# Patient Record
Sex: Female | Born: 1980 | Race: Black or African American | Hispanic: No | Marital: Single | Smoking: Current every day smoker
Health system: Southern US, Community
[De-identification: ages and names within clinical notes are randomized; demographics above are authoritative.]

## PROBLEM LIST (undated history)

## (undated) DIAGNOSIS — K219 Gastro-esophageal reflux disease without esophagitis: Secondary | ICD-10-CM

---

## 1998-02-17 ENCOUNTER — Other Ambulatory Visit: Admission: RE | Admit: 1998-02-17 | Discharge: 1998-02-17 | Payer: Self-pay | Admitting: Gynecology

## 1999-04-25 ENCOUNTER — Other Ambulatory Visit: Admission: RE | Admit: 1999-04-25 | Discharge: 1999-04-25 | Payer: Self-pay | Admitting: Gynecology

## 2000-04-25 ENCOUNTER — Other Ambulatory Visit: Admission: RE | Admit: 2000-04-25 | Discharge: 2000-04-25 | Payer: Self-pay | Admitting: Gynecology

## 2001-05-26 ENCOUNTER — Other Ambulatory Visit: Admission: RE | Admit: 2001-05-26 | Discharge: 2001-05-26 | Payer: Self-pay | Admitting: Gynecology

## 2002-07-23 ENCOUNTER — Other Ambulatory Visit: Admission: RE | Admit: 2002-07-23 | Discharge: 2002-07-23 | Payer: Self-pay | Admitting: Gynecology

## 2004-08-06 ENCOUNTER — Emergency Department (HOSPITAL_COMMUNITY): Admission: EM | Admit: 2004-08-06 | Discharge: 2004-08-06 | Payer: Self-pay | Admitting: Emergency Medicine

## 2004-10-14 ENCOUNTER — Emergency Department (HOSPITAL_COMMUNITY): Admission: EM | Admit: 2004-10-14 | Discharge: 2004-10-14 | Payer: Self-pay | Admitting: *Deleted

## 2005-10-11 ENCOUNTER — Emergency Department (HOSPITAL_COMMUNITY): Admission: EM | Admit: 2005-10-11 | Discharge: 2005-10-11 | Payer: Self-pay | Admitting: Family Medicine

## 2006-04-01 ENCOUNTER — Inpatient Hospital Stay (HOSPITAL_COMMUNITY): Admission: AD | Admit: 2006-04-01 | Discharge: 2006-04-01 | Payer: Self-pay

## 2006-04-12 ENCOUNTER — Inpatient Hospital Stay (HOSPITAL_COMMUNITY): Admission: AD | Admit: 2006-04-12 | Discharge: 2006-04-12 | Payer: Self-pay | Admitting: Obstetrics & Gynecology

## 2006-07-26 ENCOUNTER — Inpatient Hospital Stay (HOSPITAL_COMMUNITY): Admission: AD | Admit: 2006-07-26 | Discharge: 2006-07-26 | Payer: Self-pay | Admitting: Obstetrics and Gynecology

## 2006-11-18 ENCOUNTER — Inpatient Hospital Stay (HOSPITAL_COMMUNITY): Admission: AD | Admit: 2006-11-18 | Discharge: 2006-11-21 | Payer: Self-pay | Admitting: Obstetrics and Gynecology

## 2009-02-04 ENCOUNTER — Inpatient Hospital Stay (HOSPITAL_COMMUNITY): Admission: AD | Admit: 2009-02-04 | Discharge: 2009-02-04 | Payer: Self-pay | Admitting: Obstetrics and Gynecology

## 2009-05-11 ENCOUNTER — Emergency Department (HOSPITAL_COMMUNITY): Admission: EM | Admit: 2009-05-11 | Discharge: 2009-05-11 | Payer: Self-pay | Admitting: Emergency Medicine

## 2009-05-12 ENCOUNTER — Emergency Department (HOSPITAL_COMMUNITY): Admission: EM | Admit: 2009-05-12 | Discharge: 2009-05-12 | Payer: Self-pay | Admitting: Emergency Medicine

## 2009-07-19 ENCOUNTER — Inpatient Hospital Stay (HOSPITAL_COMMUNITY): Admission: AD | Admit: 2009-07-19 | Discharge: 2009-07-19 | Payer: Self-pay | Admitting: Obstetrics and Gynecology

## 2009-08-14 ENCOUNTER — Inpatient Hospital Stay (HOSPITAL_COMMUNITY): Admission: AD | Admit: 2009-08-14 | Discharge: 2009-08-14 | Payer: Self-pay | Admitting: Obstetrics and Gynecology

## 2009-08-24 ENCOUNTER — Inpatient Hospital Stay (HOSPITAL_COMMUNITY): Admission: AD | Admit: 2009-08-24 | Discharge: 2009-08-24 | Payer: Self-pay | Admitting: Obstetrics and Gynecology

## 2009-08-25 ENCOUNTER — Inpatient Hospital Stay (HOSPITAL_COMMUNITY): Admission: AD | Admit: 2009-08-25 | Discharge: 2009-08-25 | Payer: Self-pay | Admitting: Obstetrics and Gynecology

## 2009-08-26 ENCOUNTER — Inpatient Hospital Stay (HOSPITAL_COMMUNITY): Admission: AD | Admit: 2009-08-26 | Discharge: 2009-08-26 | Payer: Self-pay | Admitting: Obstetrics & Gynecology

## 2009-08-28 ENCOUNTER — Inpatient Hospital Stay (HOSPITAL_COMMUNITY): Admission: AD | Admit: 2009-08-28 | Discharge: 2009-08-28 | Payer: Self-pay | Admitting: Obstetrics & Gynecology

## 2009-08-28 ENCOUNTER — Inpatient Hospital Stay (HOSPITAL_COMMUNITY): Admission: AD | Admit: 2009-08-28 | Discharge: 2009-08-30 | Payer: Self-pay | Admitting: Obstetrics & Gynecology

## 2010-11-25 ENCOUNTER — Encounter: Payer: Self-pay | Admitting: Emergency Medicine

## 2011-02-08 LAB — CBC
HCT: 34.7 % — ABNORMAL LOW (ref 36.0–46.0)
HCT: 42.3 % (ref 36.0–46.0)
Hemoglobin: 13.8 g/dL (ref 12.0–15.0)
MCHC: 32.5 g/dL (ref 30.0–36.0)
MCV: 90.2 fL (ref 78.0–100.0)
Platelets: 206 10*3/uL (ref 150–400)
Platelets: 247 10*3/uL (ref 150–400)
RBC: 4.7 MIL/uL (ref 3.87–5.11)
RDW: 14.8 % (ref 11.5–15.5)
RDW: 15.1 % (ref 11.5–15.5)
WBC: 15.4 10*3/uL — ABNORMAL HIGH (ref 4.0–10.5)

## 2011-02-08 LAB — WET PREP, GENITAL

## 2011-02-08 LAB — URINALYSIS, ROUTINE W REFLEX MICROSCOPIC
Hgb urine dipstick: NEGATIVE
Nitrite: NEGATIVE
Specific Gravity, Urine: 1.02 (ref 1.005–1.030)
Urobilinogen, UA: 1 mg/dL (ref 0.0–1.0)

## 2011-02-08 LAB — RPR: RPR Ser Ql: NONREACTIVE

## 2011-02-08 LAB — STREP B DNA PROBE: Strep Group B Ag: NEGATIVE

## 2011-02-09 LAB — URINE MICROSCOPIC-ADD ON

## 2011-02-09 LAB — CULTURE, BETA STREP (GROUP B ONLY)

## 2011-02-09 LAB — WET PREP, GENITAL
Trich, Wet Prep: NONE SEEN
Yeast Wet Prep HPF POC: NONE SEEN

## 2011-02-09 LAB — URINALYSIS, ROUTINE W REFLEX MICROSCOPIC
Nitrite: NEGATIVE
Specific Gravity, Urine: 1.02 (ref 1.005–1.030)
pH: 6 (ref 5.0–8.0)

## 2011-02-14 LAB — URINALYSIS, ROUTINE W REFLEX MICROSCOPIC
Hgb urine dipstick: NEGATIVE
Protein, ur: 100 mg/dL — AB
Specific Gravity, Urine: 1.02 (ref 1.005–1.030)
Urobilinogen, UA: 1 mg/dL (ref 0.0–1.0)

## 2011-02-14 LAB — URINE MICROSCOPIC-ADD ON

## 2011-02-15 LAB — POCT PREGNANCY, URINE: Preg Test, Ur: POSITIVE

## 2011-03-23 NOTE — Discharge Summary (Signed)
NAMEREY, FORS                ACCOUNT NO.:  [id number]   MEDICAL RECORD NO.:  [id number]          PATIENT TYPE:  INP   LOCATION:  9125                          FACILITY:  [facility]   PHYSICIAN:  Gerrit Friends. Aldona Bar, M.D.   DATE OF BIRTH:  13-Apr-1981   DATE OF ADMISSION:  11/18/2006  DATE OF DISCHARGE:  11/21/2006                               DISCHARGE SUMMARY   DISCHARGE DIAGNOSES:  1. A 38-week intrauterine pregnancy delivered at 6 pounds, female      infant, Apgar's 5 and 8.  2. Blood type AB positive.  3. Preeclampsia.   PROCEDURES:  1. Induction of labor.  2. Normal spontaneous delivery.   HOSPITAL COURSE:  This 30 year old, G2, P0 was admitted for induction  because of mild preeclampsia.  She originally was placed on magnesium  sulfate, but on the morning of January 15, it was discontinued.  She  underwent amniotomy with production of clear fluid, having received  Cytotec through the evening of January 14/January 15.  Pitocin was  continued.  She progressed and had a normal spontaneous delivery of a  viable 6 pound, female infant with Apgar's of 5 and 8 over intact  perineum.  Her postpartum course was relatively uncomplicated.  Her baby  was finally circumcised on the morning of January 17.  Her labs on the  morning of January 17, found her hemoglobin at 11.5, white count 13,400,  platelet count 212,000.  A comprehensive metabolic profile was entirely  within normal limits.  Her alkaline phosphatase was still elevated at  1046, but had fallen from a predelivery level of 18,000-19,000.  On the  morning of January 17, her vital signs were stable.  She was breast- and  bottle-feeding.  She was comfortable and decision was made to discharge  the patient to home.  She was given all appropriate instructions and  understood all instructions well.   DISCHARGE MEDICATIONS:  1. Vitamins one a day.  2. Motrin 600 mg as needed every 6 hours for cramping.  3. Tylox one to two every 4-6 hours  as needed for more severe pain.   FOLLOW UP:  Followup in the office will be arranged 4 weeks hence.   CONDITION ON DISCHARGE:  Improved.      Gerrit Friends. Aldona Bar, M.D.  Electronically Signed     RMW/MEDQ  D:  11/21/2006  T:  11/21/2006  Job:  [id number]

## 2011-07-29 ENCOUNTER — Emergency Department (HOSPITAL_COMMUNITY): Payer: Medicaid Other

## 2011-07-29 ENCOUNTER — Emergency Department (HOSPITAL_COMMUNITY)
Admission: EM | Admit: 2011-07-29 | Discharge: 2011-07-29 | Disposition: A | Discharge status: Home / Self Care | Payer: Medicaid Other | Attending: Emergency Medicine | Admitting: Emergency Medicine

## 2011-07-29 DIAGNOSIS — N83209 Unspecified ovarian cyst, unspecified side: Secondary | ICD-10-CM | POA: Insufficient documentation

## 2011-07-29 DIAGNOSIS — R1011 Right upper quadrant pain: Secondary | ICD-10-CM | POA: Insufficient documentation

## 2011-07-29 DIAGNOSIS — R1012 Left upper quadrant pain: Secondary | ICD-10-CM | POA: Insufficient documentation

## 2011-07-29 DIAGNOSIS — R1033 Periumbilical pain: Secondary | ICD-10-CM | POA: Insufficient documentation

## 2011-07-29 LAB — URINALYSIS, ROUTINE W REFLEX MICROSCOPIC
Ketones, ur: NEGATIVE mg/dL
Leukocytes, UA: NEGATIVE
Nitrite: NEGATIVE
Urobilinogen, UA: 1 mg/dL (ref 0.0–1.0)
pH: 8 (ref 5.0–8.0)

## 2011-07-29 LAB — COMPREHENSIVE METABOLIC PANEL
ALT: 19 U/L (ref 0–35)
AST: 14 U/L (ref 0–37)
Albumin: 4 g/dL (ref 3.5–5.2)
Alkaline Phosphatase: 55 U/L (ref 39–117)
Chloride: 103 mEq/L (ref 96–112)
Potassium: 4.1 mEq/L (ref 3.5–5.1)
Sodium: 137 mEq/L (ref 135–145)
Total Bilirubin: 0.4 mg/dL (ref 0.3–1.2)
Total Protein: 7.3 g/dL (ref 6.0–8.3)

## 2011-07-29 LAB — DIFFERENTIAL
Basophils Absolute: 0 10*3/uL (ref 0.0–0.1)
Basophils Relative: 0 % (ref 0–1)
Eosinophils Absolute: 0.2 10*3/uL (ref 0.0–0.7)
Eosinophils Relative: 1 % (ref 0–5)
Lymphocytes Relative: 28 % (ref 12–46)

## 2011-07-29 LAB — CBC
HCT: 42.8 % (ref 36.0–46.0)
MCHC: 32.7 g/dL (ref 30.0–36.0)
Platelets: 232 10*3/uL (ref 150–400)
RDW: 14.9 % (ref 11.5–15.5)
WBC: 12.5 10*3/uL — ABNORMAL HIGH (ref 4.0–10.5)

## 2011-07-29 LAB — POCT PREGNANCY, URINE: Preg Test, Ur: NEGATIVE

## 2011-07-29 MED ORDER — IOHEXOL 300 MG/ML  SOLN: 100.0000 mL | Freq: Once | INTRAMUSCULAR | Status: DC | PRN

## 2012-02-18 ENCOUNTER — Encounter (HOSPITAL_COMMUNITY): Payer: Self-pay | Admitting: Emergency Medicine

## 2012-02-18 ENCOUNTER — Emergency Department (HOSPITAL_COMMUNITY)
Admission: EM | Admit: 2012-02-18 | Discharge: 2012-02-18 | Disposition: A | Discharge status: Home / Self Care | Payer: Self-pay | Attending: Emergency Medicine | Admitting: Emergency Medicine

## 2012-02-18 DIAGNOSIS — H6120 Impacted cerumen, unspecified ear: Secondary | ICD-10-CM | POA: Insufficient documentation

## 2012-02-18 DIAGNOSIS — J069 Acute upper respiratory infection, unspecified: Secondary | ICD-10-CM | POA: Insufficient documentation

## 2012-02-18 DIAGNOSIS — F17200 Nicotine dependence, unspecified, uncomplicated: Secondary | ICD-10-CM | POA: Insufficient documentation

## 2012-02-18 MED ORDER — ONDANSETRON 4 MG PO TBDP: 4.0000 mg | ORAL_TABLET | Freq: Three times a day (TID) | ORAL | Status: AC | PRN

## 2012-02-18 MED ORDER — CARBAMIDE PEROXIDE 6.5 % OT SOLN: 5.0000 [drp] | Freq: Two times a day (BID) | OTIC | Status: AC

## 2012-02-18 MED ORDER — GUAIFENESIN-CODEINE 100-10 MG/5ML PO SYRP: 5.0000 mL | ORAL_SOLUTION | Freq: Three times a day (TID) | ORAL | Status: DC | PRN

## 2012-02-18 MED ORDER — OXYMETAZOLINE HCL 0.05 % NA SOLN: 2.0000 | Freq: Two times a day (BID) | NASAL | Status: AC

## 2012-02-18 MED ORDER — GUAIFENESIN-CODEINE 100-10 MG/5ML PO SYRP: 5.0000 mL | ORAL_SOLUTION | Freq: Three times a day (TID) | ORAL | Status: AC | PRN

## 2012-02-18 NOTE — ED Notes (Signed)
Pt is c/o pain in her right ear that started about a week ago.  Pt states her sore throat, chills, body aches started about Friday  Pt states she is thirsty but is unable to drink due to pain in her throat  Pt states she has a decreased appetite

## 2012-02-18 NOTE — ED Provider Notes (Signed)
History     CSN: [id number]  Arrival date & time 02/18/12  4098   First MD Initiated Contact with Patient 02/18/12 1937      Chief Complaint  Patient presents with  . Sore Throat  . Chills    (Consider location/radiation/quality/duration/timing/severity/associated sxs/prior treatment) HPI History from patient. 31 year old female who presents with sore throat, chills, body aches, cough, congestion, all of which started about 3 days ago. She additionally complains of a "stopped up feeling" to her right ear which has been present for the past week. She did try Mucinex and ibuprofen at home for her symptoms, which was somewhat helpful. No other treatments prior to arrival. Her symptoms have been more or less constant in nature and have not worsened or improved. No known sick contacts. Denies any difficulty swallowing, breathing, although states she has had a decreased appetite due to the sore throat. Denies chest pain, shortness of breath, hemoptysis, leg swelling. She has had a cough productive of yellow sputum, which she states makes her have post tussive emesis at times. Denies abdominal pain, diarrhea, constipation. She's had no urinary symptoms or vaginal discharge.  History reviewed. No pertinent past medical history.  History reviewed. No pertinent past surgical history.  Family History  Problem Relation Age of Onset  . Hypertension Other     History  Substance Use Topics  . Smoking status: Current Everyday Smoker    Types: Cigarettes  . Smokeless tobacco: Not on file  . Alcohol Use: No    OB History    Grav Para Term Preterm Abortions TAB SAB Ect Mult Living                  Review of Systems  Constitutional: Positive for appetite change. Negative for fever, chills and activity change.  HENT: Positive for hearing loss, ear pain, congestion and sore throat. Negative for facial swelling, drooling, trouble swallowing and sinus pressure.   Eyes: Negative for discharge,  redness and visual disturbance.  Respiratory: Negative for chest tightness and shortness of breath.   Cardiovascular: Negative for chest pain and palpitations.  Gastrointestinal: Negative for nausea, vomiting and abdominal pain.  Genitourinary: Negative for decreased urine volume.  Musculoskeletal: Positive for myalgias.  Skin: Negative for color change and rash.  Neurological: Negative for dizziness, weakness and headaches.    Allergies  Ampicillin  Home Medications   Current Outpatient Rx  Name Route Sig Dispense Refill  . GUAIFENESIN ER 600 MG PO TB12 Oral Take 1,200 mg by mouth 2 (two) times daily.    . IBUPROFEN 200 MG PO TABS Oral Take 800 mg by mouth every 8 (eight) hours as needed.      BP 129/69  Pulse 84  Temp(Src) 98.9 F (37.2 C) (Oral)  Resp 18  SpO2 96%  Physical Exam  Nursing note and vitals reviewed. Constitutional: She appears well-developed and well-nourished. No distress.  HENT:  Head: Normocephalic and atraumatic.  Mouth/Throat: Oropharynx is clear and moist. No oropharyngeal exudate.       Cerumen impaction to right ear. Left ear with significant cerumen, but TM visualized and appears normal. Sinuses nontender to palpation.  Eyes: EOM are normal. Pupils are equal, round, and reactive to light.  Neck: Normal range of motion. Neck supple.  Cardiovascular: Normal rate, regular rhythm and normal heart sounds.   Pulmonary/Chest: Effort normal and breath sounds normal. No respiratory distress. She exhibits no tenderness.  Abdominal: Soft. Bowel sounds are normal. There is no tenderness.  Musculoskeletal: Normal  range of motion.  Lymphadenopathy:    She has no cervical adenopathy.  Neurological: She is alert.  Skin: Skin is warm and dry. She is not diaphoretic.  Psychiatric: She has a normal mood and affect.    ED Course  EAR CERUMEN REMOVAL Performed by: Grant Fontana Authorized by: Grant Fontana Consent: Verbal consent obtained. Risks  and benefits: risks, benefits and alternatives were discussed Consent given by: patient Local anesthetic: none Location details: right ear Procedure type: curette Patient tolerance: Patient tolerated the procedure well with no immediate complications.   (including critical care time)  Labs Reviewed - No data to display No results found.   1. URI (upper respiratory infection)   2. Cerumen impaction       MDM  Patient presents with multiple symptoms. Suspect likely viral URI. Will treat symptomatically. She was noted to have cerumen impaction, and had improvement with removal with the curette. TM was partially visualized and nl appearing. Tympanic membrane visualized Given prescription for Debrox. Return precautions discussed.        Grant Fontana, [place] 02/20/12 [phone]

## 2012-02-18 NOTE — Discharge Instructions (Signed)
1) Your ear contained wax which was blocking you from being able to hear. We removed some of the wax today. Use the Debrox drops for the next week to help soften and remove the earwax. 2) You've been given a prescription for cough syrup containing codeine to help with the cough. It's fine to continue to take Mucinex D or similar to help with the congestion. 3) Use the Afrin spray, a nasal decongestant, for the next 4 days to help with congestion. Do not use this for longer than 4 days. You may also try salt water (saline) nasal rinses and salt water gargles to help with the congestion and throat pain. 4) If you develop difficulty breathing, high fever, inability to swallow, or other worrisome symptoms, please return to the ER.  RESOURCE GUIDE  Dental Problems  Patients with Medicaid: [facility] [phone]                                           [phone] [address] Phone:  [phone]                                                  Phone:  [phone]  If unable to pay or uninsured, contact:  [facility] or [facility] to become qualified for the adult dental clinic.  Chronic Pain Problems Contact [facility]  [phone] Patients need to be referred by their primary care doctor.  Insufficient Money for Medicine Contact [employer]:  call "211" or [facility] [phone].  No Primary Care Doctor Call [facility]  [phone] Other agencies that provide inexpensive medical care    [facility]  [phone]    [facility]  [phone]    [facility]  [phone]    [facility]  [phone]    [facility]  [phone]    [facility]  [phone]  [facility]  [phone] [facility] Services  [phone] [facility]   [phone] (emergency services [phone])  Substance Abuse Resources Alcohol and  Drug Services  ([phone] [facility] ([phone] [facility] 440-604-2399 Floydene Flock ([phone] Residential & Outpatient Substance Abuse Program  639-464-0323  Abuse/Neglect [facility] Child Abuse Hotline 708 640 4884 [facility] Child Abuse Hotline (863)570-5801 (After Hours)  Emergency Shelter [facility] [phone]  M[facility] [place] [facility] 7377872961 [facility] [phone]  MRSA Hotline #:   805-026-8153    [facility] Resources  [facility]     [employer]                          [facility] Dept. [address] [place]                       [address]      [address]  [address]  Cristobal Goldmann Phone:  [phone]                                   Phone:  [phone]                 Phone:  [phone]  [facility] Phone:  [phone]  [facility] Child Abuse Hotline [phone] (After Hours)  Cerumen Impaction A cerumen impaction is when the wax in your ear forms a plug. This plug usually causes reduced hearing. Sometimes it also causes an earache or dizziness. Removing a cerumen impaction can be difficult and painful. The wax sticks to the ear canal. The canal is sensitive and bleeds easily. If you try to remove a heavy wax buildup with a cotton tipped swab, you may push it in further. Irrigation with water, suction, and small ear curettes may be used to clear out the wax. If the impaction is fixed to the skin in the ear canal, ear drops may be needed for a few days to loosen the wax. People who build up a lot of wax frequently can use ear wax removal products available in your local drugstore. SEEK MEDICAL CARE IF:  You develop an earache, increased hearing loss, or marked dizziness. Document Released:  11/29/2004 Document Revised: 10/11/2011 Document Reviewed: 01/19/2010 [facility] Patient Information 2012 [place], [place].Antibiotic Nonuse  Your caregiver felt that the infection or problem was not one that would be helped with an antibiotic. Infections may be caused by viruses or bacteria. Only a caregiver can tell which one of these is the likely cause of an illness. A cold is the most common cause of infection in both adults and children. A cold is a virus. Antibiotic treatment will have no effect on a viral infection. Viruses can lead to many lost days of work caring for sick children and many missed days of school. Children may catch as many as 10 "colds" or "flus" per year during which they can be tearful, cranky, and uncomfortable. The goal of treating a virus is aimed at keeping the ill person comfortable. Antibiotics are medications used to help the body fight bacterial infections. There are relatively few types of bacteria that cause infections but there are hundreds of viruses. While both viruses and bacteria cause infection they are very different types of germs. A viral infection will typically go away by itself within 7 to 10 days. Bacterial infections may spread or get worse without antibiotic treatment. Examples of bacterial infections are:  Sore throats (like strep throat or tonsillitis).   Infection in the lung (pneumonia).   Ear and skin infections.  Examples of viral infections are:  Colds or flus.   Most coughs and bronchitis.   Sore throats not caused by Strep.   Runny noses.  It is often best not to take an antibiotic when a viral infection is the cause of the problem. Antibiotics can kill off the helpful bacteria that we have inside our body and allow harmful bacteria to start growing. Antibiotics can cause side effects such as allergies, nausea, and diarrhea without helping to improve the symptoms of the viral infection. Additionally, repeated uses of antibiotics can  cause bacteria inside of our body to become resistant. That resistance can be  passed onto harmful bacterial. The next time you have an infection it may be harder to treat if antibiotics are used when they are not needed. Not treating with antibiotics allows our own immune system to develop and take care of infections more efficiently. Also, antibiotics will work better for [place] when they are prescribed for bacterial infections. Treatments for a child that is ill may include:  Give extra fluids throughout the day to stay hydrated.   Get plenty of rest.   Only give your child over-the-counter or prescription medicines for pain, discomfort, or fever as directed by your caregiver.   The use of a cool mist humidifier may help stuffy noses.   Cold medications if suggested by your caregiver.  Your caregiver may decide to start you on an antibiotic if:  The problem you were seen for today continues for a longer length of time than expected.   You develop a secondary bacterial infection.  SEEK MEDICAL CARE IF:  Fever lasts longer than 5 days.   Symptoms continue to get worse after 5 to 7 days or become severe.   Difficulty in breathing develops.   Signs of dehydration develop (poor drinking, rare urinating, dark colored urine).   Changes in behavior or worsening tiredness (listlessness or lethargy).  Document Released: 12/31/2001 Document Revised: 10/11/2011 Document Reviewed: 06/29/2009 [facility] Patient Information 2012 [place], [place].

## 2012-02-22 NOTE — ED Provider Notes (Signed)
Medical screening examination/treatment/procedure(s) were performed by non-physician practitioner and as supervising physician I was immediately available for consultation/collaboration.  Dionicia Cerritos K Linker, MD 02/22/12 1522 

## 2012-04-30 ENCOUNTER — Emergency Department (HOSPITAL_COMMUNITY): Payer: Self-pay

## 2012-04-30 ENCOUNTER — Encounter (HOSPITAL_COMMUNITY): Payer: Self-pay | Admitting: Emergency Medicine

## 2012-04-30 ENCOUNTER — Emergency Department (HOSPITAL_COMMUNITY)
Admission: EM | Admit: 2012-04-30 | Discharge: 2012-04-30 | Disposition: A | Discharge status: Home / Self Care | Payer: Self-pay | Attending: Emergency Medicine | Admitting: Emergency Medicine

## 2012-04-30 DIAGNOSIS — N949 Unspecified condition associated with female genital organs and menstrual cycle: Secondary | ICD-10-CM | POA: Insufficient documentation

## 2012-04-30 DIAGNOSIS — R1032 Left lower quadrant pain: Secondary | ICD-10-CM | POA: Insufficient documentation

## 2012-04-30 DIAGNOSIS — N83209 Unspecified ovarian cyst, unspecified side: Secondary | ICD-10-CM | POA: Insufficient documentation

## 2012-04-30 DIAGNOSIS — R10815 Periumbilic abdominal tenderness: Secondary | ICD-10-CM | POA: Insufficient documentation

## 2012-04-30 LAB — URINALYSIS, ROUTINE W REFLEX MICROSCOPIC
Glucose, UA: NEGATIVE mg/dL
Leukocytes, UA: NEGATIVE
Nitrite: NEGATIVE
Protein, ur: 100 mg/dL — AB
Specific Gravity, Urine: 1.037 — ABNORMAL HIGH (ref 1.005–1.030)

## 2012-04-30 LAB — CBC WITH DIFFERENTIAL/PLATELET
Basophils Absolute: 0.1 10*3/uL (ref 0.0–0.1)
Basophils Relative: 1 % (ref 0–1)
Eosinophils Absolute: 0 10*3/uL (ref 0.0–0.7)
Eosinophils Relative: 0 % (ref 0–5)
HCT: 44.2 % (ref 36.0–46.0)
Lymphocytes Relative: 20 % (ref 12–46)
MCHC: 33 g/dL (ref 30.0–36.0)
MCV: 85.5 fL (ref 78.0–100.0)
Monocytes Absolute: 0.6 10*3/uL (ref 0.1–1.0)
Platelets: 226 10*3/uL (ref 150–400)
RDW: 15 % (ref 11.5–15.5)
WBC: 13 10*3/uL — ABNORMAL HIGH (ref 4.0–10.5)

## 2012-04-30 LAB — URINE MICROSCOPIC-ADD ON

## 2012-04-30 LAB — COMPREHENSIVE METABOLIC PANEL
ALT: 12 U/L (ref 0–35)
AST: 13 U/L (ref 0–37)
Albumin: 4.3 g/dL (ref 3.5–5.2)
CO2: 17 mEq/L — ABNORMAL LOW (ref 19–32)
Calcium: 9.4 mg/dL (ref 8.4–10.5)
Creatinine, Ser: 0.58 mg/dL (ref 0.50–1.10)
GFR calc non Af Amer: >90 mL/min (ref >90)
Sodium: 136 mEq/L (ref 135–145)
Total Protein: 7.6 g/dL (ref 6.0–8.3)

## 2012-04-30 LAB — WET PREP, GENITAL
Clue Cells Wet Prep HPF POC: NONE SEEN
Trich, Wet Prep: NONE SEEN

## 2012-04-30 MED ORDER — IOHEXOL 300 MG/ML  SOLN
100.0000 mL | Freq: Once | INTRAMUSCULAR | Status: AC | PRN
  Administered 2012-04-30: 100 mL via INTRAVENOUS

## 2012-04-30 MED ORDER — MORPHINE SULFATE 4 MG/ML IJ SOLN
4.0000 mg | Freq: Once | INTRAMUSCULAR | Status: AC
  Administered 2012-04-30: 4 mg via INTRAVENOUS
  Filled 2012-04-30: qty 1

## 2012-04-30 MED ORDER — ONDANSETRON HCL 4 MG/2ML IJ SOLN
4.0000 mg | Freq: Once | INTRAMUSCULAR | Status: AC
  Administered 2012-04-30: 4 mg via INTRAVENOUS
  Filled 2012-04-30: qty 2

## 2012-04-30 MED ORDER — IBUPROFEN 800 MG PO TABS: 800.0000 mg | ORAL_TABLET | Freq: Three times a day (TID) | ORAL | Status: AC

## 2012-04-30 MED ORDER — KETOROLAC TROMETHAMINE 30 MG/ML IJ SOLN
30.0000 mg | Freq: Once | INTRAMUSCULAR | Status: AC
  Administered 2012-04-30: 30 mg via INTRAVENOUS
  Filled 2012-04-30: qty 1

## 2012-04-30 MED ORDER — HYDROCODONE-ACETAMINOPHEN 5-500 MG PO TABS: 1.0000 | ORAL_TABLET | Freq: Four times a day (QID) | ORAL | Status: AC | PRN

## 2012-04-30 MED ORDER — ONDANSETRON HCL 4 MG/2ML IJ SOLN: 4.0000 mg | Freq: Once | INTRAMUSCULAR | Status: DC

## 2012-04-30 MED ORDER — ONDANSETRON HCL 4 MG PO TABS: 4.0000 mg | ORAL_TABLET | Freq: Four times a day (QID) | ORAL | Status: AC

## 2012-04-30 NOTE — Discharge Instructions (Signed)
Use ibuprofen as directed for inflammation and pain using hydrocodone-acetaminophen for breakthrough pain but do not drive or operate machinery with hydrocodone-acetaminophen use. Use Zofran as needed for nausea. It is very important to followup with your OB/GYN in the near future to further discuss your ovarian cyst and ongoing pain however return to the emergency department for any emergent changing or worsening of symptoms.  Ovarian Cyst The ovaries are small organs that are on each side of the uterus. The ovaries are the organs that produce the female hormones, estrogen and progesterone. An ovarian cyst is a sac filled with fluid that can vary in its size. It is normal for a small cyst to form in women who are in the childbearing age and who have menstrual periods. This type of cyst is called a follicle cyst that becomes an ovulation cyst (corpus luteum cyst) after it produces the women's egg. It later goes away on its own if the woman does not become pregnant. There are other kinds of ovarian cysts that may cause problems and may need to be treated. The most serious problem is a cyst with cancer. It should be noted that menopausal women who have an ovarian cyst are at a higher risk of it being a cancer cyst. They should be evaluated very quickly, thoroughly and followed closely. This is especially true in menopausal women because of the high rate of ovarian cancer in women in menopause. CAUSES AND TYPES OF OVARIAN CYSTS:  FUNCTIONAL CYST: The follicle/corpus luteum cyst is a functional cyst that occurs every month during ovulation with the menstrual cycle. They go away with the next menstrual cycle if the woman does not get pregnant. Usually, there are no symptoms with a functional cyst.   ENDOMETRIOMA CYST: This cyst develops from the lining of the uterus tissue. This cyst gets in or on the ovary. It grows every month from the bleeding during the menstrual period. It is also called a "chocolate cyst"  because it becomes filled with blood that turns brown. This cyst can cause pain in the lower abdomen during intercourse and with your menstrual period.   CYSTADENOMA CYST: This cyst develops from the cells on the outside of the ovary. They usually are not cancerous. They can get very big and cause lower abdomen pain and pain with intercourse. This type of cyst can twist on itself, cut off its blood supply and cause severe pain. It also can easily rupture and cause a lot of pain.   DERMOID CYST: This type of cyst is sometimes found in both ovaries. They are found to have different kinds of body tissue in the cyst. The tissue includes skin, teeth, hair, and/or cartilage. They usually do not have symptoms unless they get very big. Dermoid cysts are rarely cancerous.   POLYCYSTIC OVARY: This is a rare condition with hormone problems that produces many small cysts on both ovaries. The cysts are follicle-like cysts that never produce an egg and become a corpus luteum. It can cause an increase in body weight, infertility, acne, increase in body and facial hair and lack of menstrual periods or rare menstrual periods. Many women with this problem develop type 2 diabetes. The exact cause of this problem is unknown. A polycystic ovary is rarely cancerous.   THECA LUTEIN CYST: Occurs when too much hormone (human chorionic gonadotropin) is produced and over-stimulates the ovaries to produce an egg. They are frequently seen when doctors stimulate the ovaries for invitro-fertilization (test tube babies).   LUTEOMA  CYST: This cyst is seen during pregnancy. Rarely it can cause an obstruction to the birth canal during labor and delivery. They usually go away after delivery.  SYMPTOMS   Pelvic pain or pressure.   Pain during sexual intercourse.   Increasing girth (swelling) of the abdomen.   Abnormal menstrual periods.   Increasing pain with menstrual periods.   You stop having menstrual periods and you are not  pregnant.  DIAGNOSIS  The diagnosis can be made during:  Routine or annual pelvic examination (common).   Ultrasound.   X-ray of the pelvis.   CT Scan.   MRI.   Blood tests.  TREATMENT   Treatment may only be to follow the cyst monthly for 2 to 3 months with your caregiver. Many go away on their own, especially functional cysts.   May be aspirated (drained) with a long needle with ultrasound, or by laparoscopy (inserting a tube into the pelvis through a small incision).   The whole cyst can be removed by laparoscopy.   Sometimes the cyst may need to be removed through an incision in the lower abdomen.   Hormone treatment is sometimes used to help dissolve certain cysts.   Birth control pills are sometimes used to help dissolve certain cysts.  HOME CARE INSTRUCTIONS  Follow your caregiver's advice regarding:  Medicine.   Follow up visits to evaluate and treat the cyst.   You may need to come back or make an appointment with another caregiver, to find the exact cause of your cyst, if your caregiver is not a gynecologist.   Get your yearly and recommended pelvic examinations and Pap tests.   Let your caregiver know if you have had an ovarian cyst in the past.  SEEK MEDICAL CARE IF:   Your periods are late, irregular, they stop, or are painful.   Your stomach (abdomen) or pelvic pain does not go away.   Your stomach becomes larger or swollen.   You have pressure on your bladder or trouble emptying your bladder completely.   You have painful sexual intercourse.   You have feelings of fullness, pressure, or discomfort in your stomach.   You lose weight for no apparent reason.   You feel generally ill.   You become constipated.   You lose your appetite.   You develop acne.   You have an increase in body and facial hair.   You are gaining weight, without changing your exercise and eating habits.   You think you are pregnant.  SEEK IMMEDIATE MEDICAL CARE  IF:   You have increasing abdominal pain.   You feel sick to your stomach (nausea) and/or vomit.   You develop a fever that comes on suddenly.   You develop abdominal pain during a bowel movement.   Your menstrual periods become heavier than usual.  Document Released: 10/22/2005 Document Revised: 10/11/2011 Document Reviewed: 08/25/2009 [facility] Patient Information 2012 [place], [place].

## 2012-04-30 NOTE — ED Notes (Signed)
Bed:WA13<BR> Expected date:<BR> Expected time:<BR> Means of arrival:<BR> Comments:<BR> Abdominal pain

## 2012-04-30 NOTE — ED Provider Notes (Signed)
History     CSN: [id number]  Arrival date & time 04/30/12  1029   First MD Initiated Contact with Patient 04/30/12 1031      No chief complaint on file.   (Consider location/radiation/quality/duration/timing/severity/associated sxs/prior treatment) HPI  Patient presents to emergency department by EMS with complaints of acute onset abdominal pain with associated nausea, vomiting, diarrhea that began this morning between 6 and 7 AM. Patient states that she woke suddenly with severe abdominal pain and then went to the bathroom and had subsequent vomiting and diarrhea. Patient states that since then she's had persistent and worsening abdominal pain with numerous episodes of vomiting and ongoing nausea. Patient states that she has in the Mirena IUD and therefore has unknown LMP. She's G2 P2. She has no history of abdominal surgeries. Patient denies recent sick contacts or others in her home being ill. Denies fevers, chills, chest pain, shortness of breath dysuria, hematuria, or blood in her stool. IV was started by EMS. Patient states she has no known medical problems takes no medicines on regular basis. She denies aggravating or alleviating factors.  No past medical history on file.  No past surgical history on file.  Family History  Problem Relation Age of Onset  . Hypertension Other     History  Substance Use Topics  . Smoking status: Current Everyday Smoker    Types: Cigarettes  . Smokeless tobacco: Not on file  . Alcohol Use: No    OB History    Grav Para Term Preterm Abortions TAB SAB Ect Mult Living                  Review of Systems  All other systems reviewed and are negative.    Allergies  Ampicillin  Home Medications   Current Outpatient Rx  Name Route Sig Dispense Refill  . GUAIFENESIN ER 600 MG PO TB12 Oral Take 1,200 mg by mouth 2 (two) times daily.    . IBUPROFEN 200 MG PO TABS Oral Take 800 mg by mouth every 8 (eight) hours as needed.      There were  no vitals taken for this visit.  Physical Exam  Nursing note and vitals reviewed. Constitutional: She is oriented to person, place, and time. She appears well-developed and well-nourished. No distress.  HENT:  Head: Normocephalic and atraumatic.  Eyes: Conjunctivae are normal.  Neck: Normal range of motion. Neck supple.  Cardiovascular: Normal rate, regular rhythm, normal heart sounds and intact distal pulses.  Exam reveals no gallop and no friction rub.   No murmur heard. Pulmonary/Chest: Effort normal and breath sounds normal. No respiratory distress. She has no wheezes. She has no rales. She exhibits no tenderness.  Abdominal: Soft. Bowel sounds are normal. She exhibits no distension and no mass. There is tenderness. There is no rebound and no guarding.       Abdomen soft with TTP in umbilical region. No peritoneal signs.   Genitourinary: Vagina normal. There is no rash, tenderness or lesion on the right labia. There is no rash, tenderness or lesion on the left labia. Uterus is tender. Cervix exhibits no discharge and no friability. Right adnexum displays no mass, no tenderness and no fullness. Left adnexum displays tenderness. Left adnexum displays no mass and no fullness.       Unable to visualize IUD strings. Mild pain with movement of cervix and palpation of uterus.   Musculoskeletal: Normal range of motion.  Neurological: She is alert and oriented to person, place, and  time.  Skin: Skin is warm and dry. No rash noted. She is not diaphoretic. No erythema.  Psychiatric: She has a normal mood and affect.    ED Course  Procedures (including critical care time)  IV fluids, IV morphine and zofran  Improved pain from 10/10 to 4/10   Labs Reviewed  CBC WITH DIFFERENTIAL - Abnormal; Notable for the following:    WBC 13.0 (*)     RBC 5.17 (*)     Neutro Abs 9.7 (*)     All other components within normal limits  COMPREHENSIVE METABOLIC PANEL - Abnormal; Notable for the following:     CO2 17 (*)     Glucose, Bld 131 (*)     Total Bilirubin 0.2 (*)     All other components within normal limits  URINALYSIS, ROUTINE W REFLEX MICROSCOPIC - Abnormal; Notable for the following:    Color, Urine AMBER (*)  BIOCHEMICALS MAY BE AFFECTED BY COLOR   APPearance CLOUDY (*)     Specific Gravity, Urine 1.037 (*)     Bilirubin Urine SMALL (*)     Ketones, ur TRACE (*)     Protein, ur 100 (*)     All other components within normal limits  URINE MICROSCOPIC-ADD ON - Abnormal; Notable for the following:    Squamous Epithelial / LPF FEW (*)     Bacteria, UA FEW (*)     All other components within normal limits  LIPASE, BLOOD  POCT PREGNANCY, URINE   US Transvaginal Non-ob  04/30/2012  *RADIOLOGY REPORT*  Clinical Data:  Pelvic pain.  Left lower quadrant.  TRANSABDOMINAL AND TRANSVAGINAL ULTRASOUND OF PELVIS DOPPLER ULTRASOUND OF OVARIES  Technique:  Both transabdominal and transvaginal ultrasound examinations of the pelvis were performed. Transabdominal technique was performed for global imaging of the pelvis including uterus, ovaries, adnexal regions, and pelvic cul-de-sac.  It was necessary to proceed with endovaginal exam following the transabdominal exam to visualize the adnexa.  Color and duplex Doppler ultrasound was utilized to evaluate blood flow to the ovaries.  Comparison:  CT earlier today.  Findings:  Uterus:  7.3 x 4.6 x 5.4 cm.  No myometrial masses.  Endometrium:  IUD is in place.  7 mm in thickness and uniform.  Right ovary: 3.1 x 2.2 x 2.3 cm.  Within normal limits.  Left ovary:   4.2 x 3.1 x 3.1 cm.  The left ovary contains a 1.9 x 1.8 x 1.5 cm complex cyst with heterogeneous internal signal.  Pulsed Doppler evaluation demonstrates normal low-resistance arterial and venous waveforms in both ovaries.  IMPRESSION: 1.9 cm complex cyst in the left ovary.  Follow-up ultrasound in 6 weeks is recommended to ensure resolution.  Arterial and venous flow is documented in both ovaries.  No  evidence of torsion.  Original Report Authenticated By: Donavan Burnet, M.D.   US Pelvis Complete  04/30/2012  *RADIOLOGY REPORT*  Clinical Data:  Pelvic pain.  Left lower quadrant.  TRANSABDOMINAL AND TRANSVAGINAL ULTRASOUND OF PELVIS DOPPLER ULTRASOUND OF OVARIES  Technique:  Both transabdominal and transvaginal ultrasound examinations of the pelvis were performed. Transabdominal technique was performed for global imaging of the pelvis including uterus, ovaries, adnexal regions, and pelvic cul-de-sac.  It was necessary to proceed with endovaginal exam following the transabdominal exam to visualize the adnexa.  Color and duplex Doppler ultrasound was utilized to evaluate blood flow to the ovaries.  Comparison:  CT earlier today.  Findings:  Uterus:  7.3 x 4.6 x 5.4  cm.  No myometrial masses.  Endometrium:  IUD is in place.  7 mm in thickness and uniform.  Right ovary: 3.1 x 2.2 x 2.3 cm.  Within normal limits.  Left ovary:   4.2 x 3.1 x 3.1 cm.  The left ovary contains a 1.9 x 1.8 x 1.5 cm complex cyst with heterogeneous internal signal.  Pulsed Doppler evaluation demonstrates normal low-resistance arterial and venous waveforms in both ovaries.  IMPRESSION: 1.9 cm complex cyst in the left ovary.  Follow-up ultrasound in 6 weeks is recommended to ensure resolution.  Arterial and venous flow is documented in both ovaries.  No evidence of torsion.  Original Report Authenticated By: Donavan Burnet, M.D.   Ct Abdomen Pelvis W Contrast  04/30/2012  *RADIOLOGY REPORT*  Clinical Data: 31 year old with diffuse abdominal pain and history of an IUD.  CT ABDOMEN AND PELVIS WITH CONTRAST  Technique:  Multidetector CT imaging of the abdomen and pelvis was performed following the standard protocol during bolus administration of intravenous contrast.  Contrast: OMNIPAQUE IOHEXOL 300 MG/ML  SOLN  Comparison: CT from 07/29/2011  Findings: The lung bases are clear.  There is no evidence for free intraperitoneal air.   Normal appearance of the liver, gallbladder portal venous system. Normal appearance of the pancreas, spleen, adrenal glands and both kidneys.  There is a small amount of fluid in the cul-de-sac.  An IUD is present within the uterus.  Urinary bladder is decompressed.  No gross abnormality to the right adnexa tissue.  There appears to be a peripherally enhancing irregular cyst in the left ovary that measures up to 2.9 cm.  No acute bony abnormality.  IMPRESSION:  No acute abnormalities within the abdomen or pelvis.  There is a left ovarian cyst roughly measuring 2.9 cm and a small amount of pelvic free fluid.  These findings could represent normal physiologic findings and recommend clinical correlation in this area.  Original Report Authenticated By: Richarda Overlie, M.D.   [place] Art/ven Flow Abd Pelv Doppler  04/30/2012  *RADIOLOGY REPORT*  Clinical Data:  Pelvic pain.  Left lower quadrant.  TRANSABDOMINAL AND TRANSVAGINAL ULTRASOUND OF PELVIS DOPPLER ULTRASOUND OF OVARIES  Technique:  Both transabdominal and transvaginal ultrasound examinations of the pelvis were performed. Transabdominal technique was performed for global imaging of the pelvis including uterus, ovaries, adnexal regions, and pelvic cul-de-sac.  It was necessary to proceed with endovaginal exam following the transabdominal exam to visualize the adnexa.  Color and duplex Doppler ultrasound was utilized to evaluate blood flow to the ovaries.  Comparison:  CT earlier today.  Findings:  Uterus:  7.3 x 4.6 x 5.4 cm.  No myometrial masses.  Endometrium:  IUD is in place.  7 mm in thickness and uniform.  Right ovary: 3.1 x 2.2 x 2.3 cm.  Within normal limits.  Left ovary:   4.2 x 3.1 x 3.1 cm.  The left ovary contains a 1.9 x 1.8 x 1.5 cm complex cyst with heterogeneous internal signal.  Pulsed Doppler evaluation demonstrates normal low-resistance arterial and venous waveforms in both ovaries.  IMPRESSION: 1.9 cm complex cyst in the left ovary.  Follow-up  ultrasound in 6 weeks is recommended to ensure resolution.  Arterial and venous flow is documented in both ovaries.  No evidence of torsion.  Original Report Authenticated By: Donavan Burnet, M.D.     1. Ovarian cyst       MDM  Patient is afebrile with a nonacute abdomen. Pain is much improved. CT  scan and ultrasound showed left ovarian cyst however no evidence of ovarian torsion or any other acute finding. Patient's IUD is seen in uterus and in place. Patient has an established OB/GYN and is agreeable to close followup for further evaluation and management. Discussed changing or worsening symptoms that should prompt immediate return to emergency department. Patient voiced her understanding and is agreeable plan.        [place], [place] 04/30/12 1520

## 2012-04-30 NOTE — ED Notes (Signed)
Pt returned from ultrasound

## 2012-04-30 NOTE — ED Provider Notes (Signed)
Medical screening examination/treatment/procedure(s) were performed by non-physician practitioner and as supervising physician I was immediately available for consultation/collaboration.  Flint Melter, MD 04/30/12 [phone]

## 2012-04-30 NOTE — ED Notes (Signed)
Pt presenting to ed with c/o abdominal pain with positive nausea, vomiting and diarrhea onset 7:00am. Pt is alert and oriented at this time

## 2012-05-01 LAB — GC/CHLAMYDIA PROBE AMP, GENITAL: Chlamydia, DNA Probe: NEGATIVE

## 2013-11-23 ENCOUNTER — Encounter (HOSPITAL_COMMUNITY): Payer: Self-pay | Admitting: Emergency Medicine

## 2013-11-23 ENCOUNTER — Emergency Department (INDEPENDENT_AMBULATORY_CARE_PROVIDER_SITE_OTHER)
Admission: EM | Admit: 2013-11-23 | Discharge: 2013-11-23 | Disposition: A | Discharge status: Home / Self Care | Payer: Self-pay | Source: Home / Self Care | Attending: Family Medicine | Admitting: Family Medicine

## 2013-11-23 DIAGNOSIS — J020 Streptococcal pharyngitis: Secondary | ICD-10-CM

## 2013-11-23 MED ORDER — CLINDAMYCIN HCL 300 MG PO CAPS: 300.0000 mg | ORAL_CAPSULE | Freq: Four times a day (QID) | ORAL | Status: DC

## 2013-11-23 MED ORDER — CLINDAMYCIN HCL 300 MG PO CAPS: 150.0000 mg | ORAL_CAPSULE | Freq: Three times a day (TID) | ORAL | Status: DC

## 2013-11-23 NOTE — Discharge Instructions (Signed)
Drink lots of fluids, take all of medicine, use lozenges as needed.return if needed °

## 2013-11-23 NOTE — ED Provider Notes (Signed)
CSN: [id number]     Arrival date & time 11/23/13  1545 History   First MD Initiated Contact with Patient 11/23/13 1640     No chief complaint on file.  (Consider location/radiation/quality/duration/timing/severity/associated sxs/prior Treatment) Patient is a 33 y.o. female presenting with pharyngitis. The history is provided by the patient.  Sore Throat This is a new problem. The current episode started yesterday. The problem has been gradually worsening. Pertinent negatives include no chest pain and no abdominal pain. The symptoms are aggravated by swallowing.    No past medical history on file. No past surgical history on file. Family History  Problem Relation Age of Onset  . Hypertension Other    History  Substance Use Topics  . Smoking status: Current Every Day Smoker    Types: Cigarettes  . Smokeless tobacco: Not on file  . Alcohol Use: No   OB History   Grav Para Term Preterm Abortions TAB SAB Ect Mult Living                 Review of Systems  Constitutional: Negative.   HENT: Positive for ear pain and sore throat.   Cardiovascular: Negative for chest pain.  Gastrointestinal: Negative for abdominal pain.    Allergies  Ampicillin  Home Medications   Current Outpatient Rx  Name  Route  Sig  Dispense  Refill  . bismuth subsalicylate (PEPTO BISMOL) 262 MG/15ML suspension   Oral   Take 15 mLs by mouth every 6 (six) hours as needed. Upset stomach         . clindamycin (CLEOCIN) 300 MG capsule   Oral   Take 1 capsule (300 mg total) by mouth 3 (three) times daily.   21 capsule   0   . levonorgestrel (MIRENA) 20 MCG/24HR IUD   Intrauterine   1 each by Intrauterine route once.          BP 129/80  Pulse 76  Temp(Src) 99.2 F (37.3 C) (Oral)  Resp 16  SpO2 98% Physical Exam  Nursing note and vitals reviewed. Constitutional: She is oriented to person, place, and time. She appears well-developed and well-nourished.  HENT:  Head: Normocephalic.  Right  Ear: External ear normal.  Left Ear: External ear normal.  Mouth/Throat: Uvula is midline and mucous membranes are normal. Oropharyngeal exudate and posterior oropharyngeal erythema present.  Eyes: Conjunctivae are normal. Pupils are equal, round, and reactive to light.  Neck: Normal range of motion. Neck supple.  Cardiovascular: Regular rhythm.   Pulmonary/Chest: Breath sounds normal.  Lymphadenopathy:    She has cervical adenopathy.  Neurological: She is alert and oriented to person, place, and time.  Skin: Skin is warm and dry.    ED Course  Procedures (including critical care time) Labs Review Labs Reviewed - No data to display Imaging Review No results found.  EKG Interpretation    Date/Time:    Ventricular Rate:    PR Interval:    QRS Duration:   QT Interval:    QTC Calculation:   R Axis:     Text Interpretation:              MDM      Christina HoffJames D Tabor Bartram, MD 11/23/13 1651

## 2013-11-23 NOTE — ED Notes (Signed)
Dr. Artis FlockKindl has seen pt already Pt c/o sore throat onset yest w/sxs that include: ear pain and pain when swallowing Denies: f/v/n/d, SOB, wheezing Alert w/no signs of acute distress.

## 2014-10-17 ENCOUNTER — Encounter (HOSPITAL_COMMUNITY): Payer: Self-pay | Admitting: *Deleted

## 2014-10-17 ENCOUNTER — Emergency Department (HOSPITAL_COMMUNITY)
Admission: EM | Admit: 2014-10-17 | Discharge: 2014-10-17 | Disposition: A | Discharge status: Home / Self Care | Payer: Self-pay | Attending: Emergency Medicine | Admitting: Emergency Medicine

## 2014-10-17 ENCOUNTER — Emergency Department (HOSPITAL_COMMUNITY): Payer: Self-pay

## 2014-10-17 DIAGNOSIS — R52 Pain, unspecified: Secondary | ICD-10-CM

## 2014-10-17 DIAGNOSIS — R0602 Shortness of breath: Secondary | ICD-10-CM | POA: Insufficient documentation

## 2014-10-17 DIAGNOSIS — R079 Chest pain, unspecified: Secondary | ICD-10-CM

## 2014-10-17 DIAGNOSIS — Z720 Tobacco use: Secondary | ICD-10-CM | POA: Insufficient documentation

## 2014-10-17 DIAGNOSIS — K219 Gastro-esophageal reflux disease without esophagitis: Secondary | ICD-10-CM | POA: Insufficient documentation

## 2014-10-17 DIAGNOSIS — R0789 Other chest pain: Secondary | ICD-10-CM | POA: Insufficient documentation

## 2014-10-17 LAB — LIPASE, BLOOD: Lipase: 63 U/L — ABNORMAL HIGH (ref 11–59)

## 2014-10-17 LAB — CBC WITH DIFFERENTIAL/PLATELET
BASOS ABS: 0 10*3/uL (ref 0.0–0.1)
Basophils Relative: 0 % (ref 0–1)
EOS ABS: 0.2 10*3/uL (ref 0.0–0.7)
Eosinophils Relative: 2 % (ref 0–5)
HCT: 42.5 % (ref 36.0–46.0)
HEMOGLOBIN: 13.9 g/dL (ref 12.0–15.0)
LYMPHS PCT: 44 % (ref 12–46)
Lymphs Abs: 3.7 10*3/uL (ref 0.7–4.0)
MCH: 28.2 pg (ref 26.0–34.0)
MCHC: 32.7 g/dL (ref 30.0–36.0)
MCV: 86.2 fL (ref 78.0–100.0)
MONOS PCT: 11 % (ref 3–12)
Monocytes Absolute: 0.9 10*3/uL (ref 0.1–1.0)
NEUTROS PCT: 43 % (ref 43–77)
Neutro Abs: 3.6 10*3/uL (ref 1.7–7.7)
Platelets: 257 10*3/uL (ref 150–400)
RBC: 4.93 MIL/uL (ref 3.87–5.11)
RDW: 14.3 % (ref 11.5–15.5)
WBC: 8.4 10*3/uL (ref 4.0–10.5)

## 2014-10-17 LAB — COMPREHENSIVE METABOLIC PANEL
ALBUMIN: 3.9 g/dL (ref 3.5–5.2)
ALT: 13 U/L (ref 0–35)
ANION GAP: 15 (ref 5–15)
AST: 17 U/L (ref 0–37)
Alkaline Phosphatase: 54 U/L (ref 39–117)
BUN: 10 mg/dL (ref 6–23)
CALCIUM: 9.6 mg/dL (ref 8.4–10.5)
CO2: 23 mEq/L (ref 19–32)
Chloride: 102 mEq/L (ref 96–112)
Creatinine, Ser: 0.67 mg/dL (ref 0.50–1.10)
GFR calc Af Amer: >90 mL/min (ref >90)
GFR calc non Af Amer: >90 mL/min (ref >90)
Glucose, Bld: 106 mg/dL — ABNORMAL HIGH (ref 70–99)
Potassium: 4.7 mEq/L (ref 3.7–5.3)
SODIUM: 140 meq/L (ref 137–147)
TOTAL PROTEIN: 7.5 g/dL (ref 6.0–8.3)
Total Bilirubin: 0.2 mg/dL — ABNORMAL LOW (ref 0.3–1.2)

## 2014-10-17 LAB — I-STAT TROPONIN, ED: TROPONIN I, POC: 0 ng/mL (ref 0.00–0.08)

## 2014-10-17 MED ORDER — MORPHINE SULFATE 4 MG/ML IJ SOLN
4.0000 mg | Freq: Once | INTRAMUSCULAR | Status: AC
  Administered 2014-10-17: 4 mg via INTRAVENOUS
  Filled 2014-10-17: qty 1

## 2014-10-17 MED ORDER — GI COCKTAIL ~~LOC~~
30.0000 mL | Freq: Once | ORAL | Status: AC
  Administered 2014-10-17: 30 mL via ORAL
  Filled 2014-10-17: qty 30

## 2014-10-17 MED ORDER — MORPHINE SULFATE 2 MG/ML IJ SOLN
2.0000 mg | Freq: Once | INTRAMUSCULAR | Status: AC
  Administered 2014-10-17: 2 mg via INTRAVENOUS
  Filled 2014-10-17: qty 1

## 2014-10-17 MED ORDER — ONDANSETRON HCL 8 MG PO TABS: 8.0000 mg | ORAL_TABLET | Freq: Three times a day (TID) | ORAL | Status: DC | PRN

## 2014-10-17 MED ORDER — HYDROCODONE-ACETAMINOPHEN 5-325 MG PO TABS: 1.0000 | ORAL_TABLET | Freq: Four times a day (QID) | ORAL | Status: DC | PRN

## 2014-10-17 MED ORDER — SODIUM CHLORIDE 0.9 % IV BOLUS (SEPSIS)
500.0000 mL | Freq: Once | INTRAVENOUS | Status: AC
  Administered 2014-10-17: 500 mL via INTRAVENOUS

## 2014-10-17 MED ORDER — OMEPRAZOLE 20 MG PO CPDR: 20.0000 mg | DELAYED_RELEASE_CAPSULE | Freq: Every day | ORAL | Status: DC

## 2014-10-17 NOTE — Discharge Instructions (Signed)
Food Choices for Gastroesophageal Reflux Disease When you have gastroesophageal reflux disease (GERD), the foods you eat and your eating habits are very important. Choosing the right foods can help ease the discomfort of GERD. WHAT GENERAL GUIDELINES DO I NEED TO FOLLOW?  Choose fruits, vegetables, whole grains, low-fat dairy products, and low-fat meat, fish, and poultry.  Limit fats such as oils, salad dressings, butter, nuts, and avocado.  Keep a food diary to identify foods that cause symptoms.  Avoid foods that cause reflux. These may be different for different people.  Eat frequent small meals instead of three large meals each day.  Eat your meals slowly, in a relaxed setting.  Limit fried foods.  Cook foods using methods other than frying.  Avoid drinking alcohol.  Avoid drinking large amounts of liquids with your meals.  Avoid bending over or lying down until 2-3 hours after eating. WHAT FOODS ARE NOT RECOMMENDED? The following are some foods and drinks that may worsen your symptoms: Vegetables Tomatoes. Tomato juice. Tomato and spaghetti sauce. Chili peppers. Onion and garlic. Horseradish. Fruits Oranges, grapefruit, and lemon (fruit and juice). Meats High-fat meats, fish, and poultry. This includes hot dogs, ribs, ham, sausage, salami, and bacon. Dairy Whole milk and chocolate milk. Sour cream. Cream. Butter. Ice cream. Cream cheese.  Beverages Coffee and tea, with or without caffeine. Carbonated beverages or energy drinks. Condiments Hot sauce. Barbecue sauce.  Sweets/Desserts Chocolate and cocoa. Donuts. Peppermint and spearmint. Fats and Oils High-fat foods, including [place] fries and potato chips. Other Vinegar. Strong spices, such as black pepper, white pepper, red pepper, cayenne, curry powder, cloves, ginger, and chili powder. The items listed above may not be a complete list of foods and beverages to avoid. Contact your dietitian for more  information. Document Released: 10/22/2005 Document Revised: 10/27/2013 Document Reviewed: 08/26/2013 [facility] Patient Information 2015 [place], [place]. This information is not intended to replace advice given to you by your health care provider. Make sure you discuss any questions you have with your health care provider.  Gastroesophageal Reflux Disease, Adult Gastroesophageal reflux disease (GERD) happens when acid from your stomach flows up into the esophagus. When acid comes in contact with the esophagus, the acid causes soreness (inflammation) in the esophagus. Over time, GERD may create small holes (ulcers) in the lining of the esophagus. CAUSES   Increased body weight. This puts pressure on the stomach, making acid rise from the stomach into the esophagus.  Smoking. This increases acid production in the stomach.  Drinking alcohol. This causes decreased pressure in the lower esophageal sphincter (valve or ring of muscle between the esophagus and stomach), allowing acid from the stomach into the esophagus.  Late evening meals and a full stomach. This increases pressure and acid production in the stomach.  A malformed lower esophageal sphincter. Sometimes, no cause is found. SYMPTOMS   Burning pain in the lower part of the mid-chest behind the breastbone and in the mid-stomach area. This may occur twice a week or more often.  Trouble swallowing.  Sore throat.  Dry cough.  Asthma-like symptoms including chest tightness, shortness of breath, or wheezing. DIAGNOSIS  Your caregiver may be able to diagnose GERD based on your symptoms. In some cases, X-rays and other tests may be done to check for complications or to check the condition of your stomach and esophagus. TREATMENT  Your caregiver may recommend over-the-counter or prescription medicines to help decrease acid production. Ask your caregiver before starting or adding any new medicines.  HOME  CARE INSTRUCTIONS   Change the  factors that you can control. Ask your caregiver for guidance concerning weight loss, quitting smoking, and alcohol consumption.  Avoid foods and drinks that make your symptoms worse, such as:  Caffeine or alcoholic drinks.  Chocolate.  Peppermint or mint flavorings.  Garlic and onions.  Spicy foods.  Citrus fruits, such as oranges, lemons, or limes.  Tomato-based foods such as sauce, chili, salsa, and pizza.  Fried and fatty foods.  Avoid lying down for the 3 hours prior to your bedtime or prior to taking a nap.  Eat small, frequent meals instead of large meals.  Wear loose-fitting clothing. Do not wear anything tight around your waist that causes pressure on your stomach.  Raise the head of your bed 6 to 8 inches with wood blocks to help you sleep. Extra pillows will not help.  Only take over-the-counter or prescription medicines for pain, discomfort, or fever as directed by your caregiver.  Do not take aspirin, ibuprofen, or other nonsteroidal anti-inflammatory drugs (NSAIDs). SEEK IMMEDIATE MEDICAL CARE IF:   You have pain in your arms, neck, jaw, teeth, or back.  Your pain increases or changes in intensity or duration.  You develop nausea, vomiting, or sweating (diaphoresis).  You develop shortness of breath, or you faint.  Your vomit is green, yellow, black, or looks like coffee grounds or blood.  Your stool is red, bloody, or black. These symptoms could be signs of other problems, such as heart disease, gastric bleeding, or esophageal bleeding. MAKE SURE YOU:   Understand these instructions.  Will watch your condition.  Will get help right away if you are not doing well or get worse. Document Released: 08/01/2005 Document Revised: 01/14/2012 Document Reviewed: 05/11/2011 [facility] Patient Information 2015 [place]. This information is not intended to replace advice given to you by your health care provider. Make sure you discuss any questions you have  with your health care provider.   Emergency Department Resource Guide 1) Find a Doctor and Pay Out of Pocket Although you won't have to find out who is covered by your insurance plan, it is a good idea to ask around and get recommendations. You will then need to call the office and see if the doctor you have chosen will accept you as a new patient and what types of options they offer for patients who are self-pay. Some doctors offer discounts or will set up payment plans for their patients who do not have insurance, but you will need to ask so you aren't surprised when you get to your appointment.  2) Contact Your Local Health Department Not all health departments have doctors that can see patients for sick visits, but many do, so it is worth a call to see if yours does. If you don't know where your local health department is, you can check in your phone book. The CDC also has a tool to help you locate your state's health department, and many state websites also have listings of all of their local health departments.  3) Find a Walk-in Clinic If your illness is not likely to be very severe or complicated, you may want to try a walk in clinic. These are popping up all over the country in pharmacies, drugstores, and shopping centers. They're usually staffed by nurse practitioners or physician assistants that have been trained to treat common illnesses and complaints. They're usually fairly quick and inexpensive. However, if you have serious medical issues or chronic medical problems, these  are probably not your best option.  No Primary Care Doctor: - Call Health Connect at  [phone] - they can help you locate a primary care doctor that  accepts your insurance, provides certain services, etc. - Physician Referral Service- [phone]  Chronic Pain Problems: Organization         Address  Phone   Notes  [facility]  [phone] Patients need to be referred by their primary  care doctor.   Medication Assistance: Organization         Address  Phone   Notes  [facility] Medication [facility] [address] [place] [place], [place] [address] [phone] --Must be a resident of [facility] -- Must have NO insurance coverage whatsoever (no Medicaid/ Medicare, etc.) -- The pt. MUST have a primary care doctor that directs their care regularly and follows them in the community   [facility]ist  [phone]   [employer]  [phone]    Agencies that provide inexpensive medical care: Organization         Address  Phone   Notes  [facility]  [phone]   [facility]    [phone]   [facility] [address] [place], [place] [address] [phone]   [facility] [place] [address] [place] [phone]   [facility]anned Parenthood    [phone]   [facility]    [phone]   [facility]  [address], [place] Phone:  [phone], Fax:  [phone] Hours of Operation:  9 am - 6 pm, M-F.  Also accepts Medicaid/Medicare and self-pay.  [facility]  [address], [place] Phone: [phone], Fax: [phone]. Hours of Operation:  8:30 am - 5:30 pm, M-F.  Also accepts Medicaid and self-pay.  [facility] [place] [address], [place] Phone: [phone]   [facility] [address] Natasha Bence [place], [place] [phone], Ext. [phone] Mondays & Thursdays: 7-9 AM.  First 15 patients are seen on a first come, first serve basis.    Medicaid-accepting [facility] Providers:  Organization         Address  Phone   Notes  [facility] [address], [place] [phone] Also accepts self-pay patients.  [facility] [address] Laurell Josephs [place], [place]  [phone]   [facility] [address], [place] [phone]   [facility] [address] [place] [phone]   Renaye Rakers [address], [place]   [phone] Only accepts [place] Access [place] patients after they have their name applied to their card.   Self-Pay (no insurance) in [facility]:  Organization         Address  Phone   Notes  Sickle Cell Patients, [facility] [address] [place], [place] [phone]   [facility] [address] [place], [place] [phone]   [facility]  [address], [place] [phone]   [facility]/Dr. Osei-Bonsu  [address], [place] or [address], [place] [phone] Phone number for both [place] and [place] locations is the same.  [facility] [address], [place] [phone]   [facility] [address] [place] [address], [place] or [address] [phone]   [facility] [address], [place] [phone], phone; [phone], fax Sees patients 1st and 3rd Saturday of every month.  Must not qualify for public or private insurance (i.e. Medicaid, Medicare, [place] Health Choice, Veterans' Benefits)  Household income should be no more than 200% of the poverty level The clinic cannot treat you if you are pregnant or think you are pregnant  Sexually transmitted diseases are not treated at the clinic.    Dental Care: Organization         Address  Phone  Notes  [place] [facility] [address], [place] [phone] Accepts children up to age 35 who are enrolled in [place] or [place] Health Choice; pregnant women with a Medicaid card; and children who have applied for Medicaid or [place] Health Choice, but were declined, whose parents can pay a reduced fee at  time of service.  [facility] [place]  [address], [place] [phone] Accepts children up to age 35 who are enrolled in [place] or [place] Health Choice; pregnant women with a Medicaid card; and children who have applied for Medicaid or [place] Health Choice, but were declined, whose parents can pay a reduced fee at time of service.  [facility]  [address], [place] [phone] Patients are seen by appointment only. Walk-ins are not accepted. [facility] will see patients 35 years of age and older. Monday - Tuesday (8am-5pm) Most Wednesdays (8:30-5pm) $30 per visit, cash only  [facility]  [address], [facility] [place] [phone] Patients are seen by appointment only. Walk-ins are not accepted. [facility] will see patients 35 years of age and older. One Wednesday Evening (Monthly: Volunteer Based).  $30 per visit, cash only  [facility]  [phone] for adults; Children under age 364, call Graduate Pediatric Dentistry at [phone]. Children aged 544-14, please call [phone] to request a pediatric application.  Dental services are provided in all areas of dental care including fillings, crowns and bridges, complete and partial dentures, implants, gum treatment, root canals, and extractions. Preventive care is also provided. Treatment is provided to both adults and children. Patients are selected via a lottery and there is often a waiting list.   [facility] [address], [place]  [phone] [email or web address]   [facility] [address], [place], [place] [phone], Ext. [phone] Second and Fourth Thursday of each month, opens at 6:30 AM; Clinic ends at 9 AM.  Patients are seen on a first-come first-served basis, and a limited number are seen during each clinic.   [facility]  [address], [place], [place] [phone]   Eligibility Requirements You must have lived in [place], [place], or [place] counties for at least the last three months.   You cannot be eligible for state or federal sponsored [place] Cityhealthcare insurance, including CIGNAVeterans Administration, [place]icaid, or [employer] EntertainmentMedicare.   You generally cannot be eligible for healthcare insurance through your employer.    How to apply: Eligibility screenings are held every Tuesday and Wednesday afternoon from 1:00 pm until 4:00 pm. You do not need an appointment for the interview!  [facility] [address], [place], [place] [phone]   °[facility]  [phone]   °[facility]  336-703-3100   °[facility]  336-570-6415   ° °Behavioral Health Resources in the Community: °Intensive Outpatient Programs °Organization         Address  Phone  Notes  °[place] [facility] [address], [place], [place] [phone]   °[facility] [address], [place], [place] [phone]   °[facility] [address], [place], [place] ° 336-882-2125   °[facility] [address],  °[place], [place] 1-800-853-5163 or 336-641-4981   °Substance Abuse Resources °Organization         Address  Phone  Notes  °[facility]  336-882-2125   °[facility]  336-784-9470   °[facility]  336-285-9073   °[facility]  336-845-3988   °Residential & Outpatient Substance Abuse Program  1-800-659-3381   °Psychological Services °Organization         Address  Phone  Notes  ° Health  336- 832-9600   °[facility]  336- 378-7881   °[facility] [address], [place] 1-800-853-5163 or 336-641-4981   ° °Mobile Crisis Teams °Organization         Address  Phone  Notes  °[facility], Mobile Crisis Care Unit  1-877-626-1772   °Assertive °Psychotherapeutic  Services ° [address] [place], [place] 336-834-9664   °Sharon DeEsch [address] °[place] 336-554-5454   ° °Self-Help/Support Groups °Organization         Address  Phone             Notes  °[facility] [place] - variety of support groups  336- 373-1402 Call for more information  °Narcotics Anonymous (NA), [facility] [address], °[place]  2 meetings at this location  ° °Residential Treatment Programs °Organization         Address  Phone  Notes  °[facility] [address],    °[place]  1-866-801-8205   °[facility] ° [address], [place], [place] [phone]   °[facility] [address], [place] 336-845-3988 Admissions: 8am-3pm M-F  °[facility] [address]    °[place], [place] 336-841-1104   °[facility] [address], [place], [place] 336-379-7146   °[facility] [address]  °[place], [place] 336-285-9073   °[facility] - Intensive Outpatient [address], [place], [place] 336-852-3033   °[facility] [address]  °[place], [place] 1-877-615-2722 or 336-784-9470   °[facility] ([facility]) [address] [place], [place] 336-227-7417 Accepts Medicaid  °[facility] [address]  ° [place] 1-800-659-3381 Substance Abuse/Addiction Treatment  ° °[place] [facility] °Organization         Address  Phone  Notes  °[facility]  (888) 581-9988   °Julie Brannon, PhD [address] [place], [place]   (336) 349-5553 or (336) 951-0000   °[facility]   [address] °[place], [place] (336) 349-4454   °[facility] [address], [place],  (336) 342-8316 Insurance/Medicaid/sponsorship through [facility]  °[facility] [address]                                      [place], [place] [phone] [place], [place] [phone]    Dr. Adele Schilder  [phone]   [facility] Dept. 1) [address] [place] 2) [place] 3)  [address], [place] [phone]  [phone]   [place] [phone] or [phone] (After Hours)

## 2014-10-17 NOTE — ED Notes (Addendum)
Patient c/o left chest pain since Thursday past.  She stated she had brief relief of pain when she took antacids on Friday, but pain returned with worsening intensity today.  Patient denies SOB, diaphoresis and dizziness, but endorses N/V since Friday.  Patient describes pain as chest tightness.  EKG completed and given to EDP N. Pickering.

## 2014-10-17 NOTE — ED Provider Notes (Signed)
CSN: [id number]     Arrival date & time 10/17/14  1345 History   First MD Initiated Contact with Patient 10/17/14 1356     Chief Complaint  Patient presents with  . Chest Pain   HPI  Patient is a 33 year old female who presents emergency room with her sister for evaluation of chest pain. Patient states that she's been having intermittent, severe burning, cramping chest pain that is substernal in nature. Patient states that she has never had pain like this before. She states that it started on Thursday. She tried taking antacids on Friday which relieved her pain. The pain returned yesterday, but got worse today. Her pain is relieved with sitting up. Her pain is not worse with movement, eating or drinking, or palpation. Patient denies any issues with her heart in the past. She is a current every day smoker. She smokes usually 4-5 cigarettes per day. She has been smoking for many years. Patient denies any family history of cardiac problems. Patient is otherwise healthy. She denies any recent surgeries, cancer, history of blood clots, recent immobilization, or estrogen use. She does have a Mirena implant. Patient does report nausea and vomiting 2 over the past several days.  History reviewed. No pertinent past medical history. History reviewed. No pertinent past surgical history. Family History  Problem Relation Age of Onset  . Hypertension Other    History  Substance Use Topics  . Smoking status: Current Every Day Smoker    Types: Cigarettes  . Smokeless tobacco: Never Used  . Alcohol Use: No   OB History    No data available     Review of Systems  Constitutional: Negative for fever, chills and fatigue.  Respiratory: Positive for chest tightness and shortness of breath. Negative for cough and wheezing.   Cardiovascular: Positive for chest pain. Negative for palpitations and leg swelling.  Gastrointestinal: Positive for nausea and vomiting. Negative for abdominal pain, diarrhea and  constipation.  Neurological: Negative for dizziness, seizures and headaches.  All other systems reviewed and are negative.     Allergies  Ampicillin  Home Medications   Prior to Admission medications   Medication Sig Start Date End Date Taking? Authorizing Provider  bismuth subsalicylate (PEPTO BISMOL) 262 MG/15ML suspension Take 15 mLs by mouth every 6 (six) hours as needed. Upset stomach   Yes Historical Provider, MD  levonorgestrel (MIRENA) 20 MCG/24HR IUD 1 each by Intrauterine route once.   Yes Historical Provider, MD  clindamycin (CLEOCIN) 300 MG capsule Take 1 capsule (300 mg total) by mouth 3 (three) times daily. Patient not taking: Reported on 10/17/2014 11/23/13   Linna HoffJames D Kindl, MD  HYDROcodone-acetaminophen (NORCO/VICODIN) 5-325 MG per tablet Take 1 tablet by mouth every 6 (six) hours as needed for moderate pain or severe pain. 10/17/14   Anitha Kreiser A Forcucci, PA-C  omeprazole (PRILOSEC) 20 MG capsule Take 1 capsule (20 mg total) by mouth daily. 10/17/14   Miche Loughridge A Forcucci, PA-C  ondansetron (ZOFRAN) 8 MG tablet Take 1 tablet (8 mg total) by mouth every 8 (eight) hours as needed for nausea or vomiting. 10/17/14   Niccole Witthuhn A Forcucci, PA-C   BP 103/60 mmHg  Pulse 63  Temp(Src) 99.3 F (37.4 C) (Oral)  Resp 17  SpO2 99% Physical Exam  Constitutional: She is oriented to person, place, and time. She appears well-developed and well-nourished. No distress.  HENT:  Head: Normocephalic and atraumatic.  Mouth/Throat: Oropharynx is clear and moist. No oropharyngeal exudate.  Eyes: Conjunctivae and EOM are  normal. Pupils are equal, round, and reactive to light. No scleral icterus.  Neck: Normal range of motion. Neck supple. No JVD present. No thyromegaly present.  Cardiovascular: Normal rate, regular rhythm, normal heart sounds and intact distal pulses.  Exam reveals no gallop and no friction rub.   No murmur heard. Pulmonary/Chest: Effort normal and breath sounds normal.   Abdominal: Soft. Bowel sounds are normal. She exhibits no distension and no mass. There is no tenderness. There is no rebound and no guarding.  Musculoskeletal: Normal range of motion.  Lymphadenopathy:    She has no cervical adenopathy.  Neurological: She is alert and oriented to person, place, and time. She has normal strength. No cranial nerve deficit or sensory deficit.  Skin: Skin is warm and dry. She is not diaphoretic.  Psychiatric: She has a normal mood and affect. Her behavior is normal. Judgment and thought content normal.  Nursing note and vitals reviewed.   ED Course  Procedures (including critical care time) Labs Review Labs Reviewed  COMPREHENSIVE METABOLIC PANEL - Abnormal; Notable for the following:    Glucose, Bld 106 (*)    Total Bilirubin 0.2 (*)    All other components within normal limits  LIPASE, BLOOD - Abnormal; Notable for the following:    Lipase 63 (*)    All other components within normal limits  CBC WITH DIFFERENTIAL  I-STAT TROPOININ, ED    Imaging Review Dg Chest 2 View  10/17/2014   CLINICAL DATA:  Left-sided chest pain for 3 days  EXAM: CHEST  2 VIEW  COMPARISON:  None.  FINDINGS: The heart size and mediastinal contours are within normal limits. Both lungs are clear. The visualized skeletal structures are unremarkable.  IMPRESSION: No active cardiopulmonary disease.   Electronically Signed   By: Alcide CleverMark  Lukens M.D.   On: 10/17/2014 14:17     EKG Interpretation   Date/Time:  Sunday October 17 2014 13:47:57 EST Ventricular Rate:  70 PR Interval:  167 QRS Duration: 76 QT Interval:  363 QTC Calculation: 392 R Axis:   35 Text Interpretation:  Sinus rhythm Low voltage, precordial leads Probable  anteroseptal infarct, old No old tracing to compare Confirmed by KNAPP   MD-J, JON ([id number]) on 10/17/2014 2:42:15 PM      MDM   Final diagnoses:  Chest pain, unspecified chest pain type  Gastroesophageal reflux disease, esophagitis presence not  specified   Patient is a 33 year old female who presents emergency room for evaluation of chest pain. Physical exam reveals uncomfortable appearing female with no reproducible chest tenderness on palpation and no abdominal tenderness. Patient is perk negative. Patient has a heart score of 0. History sounds more compatible with GERD versus esophageal spasm versus possible PUD. Agent to return to the emergency room for worsening chest pain, shortness of breath, or any other concerning symptoms. Patient states understanding and agreement at this time. I will discharge the patient home with hydrocodone, omeprazole, Zofran. I will give the patient referral [facility]. Patient to follow-up with him next week. Patient states understanding and agreement. I discussed this patient with Dr. Lynelle DoctorKnapp who agrees with the above workup and plan.    Eben Burowourtney A Forcucci, PA-C 10/17/14 1620  Linwood DibblesJon Knapp, MD 10/18/14 [phone]

## 2014-10-17 NOTE — ED Notes (Signed)
Per PA, pt to remain in ED for 30-45 minutes longer to allow pain med to work, then d/c

## 2014-10-19 ENCOUNTER — Encounter (HOSPITAL_COMMUNITY): Payer: Self-pay | Admitting: Family Medicine

## 2014-10-19 ENCOUNTER — Emergency Department (INDEPENDENT_AMBULATORY_CARE_PROVIDER_SITE_OTHER)
Admission: EM | Admit: 2014-10-19 | Discharge: 2014-10-19 | Disposition: A | Discharge status: Home / Self Care | Payer: Self-pay | Source: Home / Self Care | Attending: Family Medicine | Admitting: Family Medicine

## 2014-10-19 DIAGNOSIS — H109 Unspecified conjunctivitis: Secondary | ICD-10-CM

## 2014-10-19 DIAGNOSIS — A499 Bacterial infection, unspecified: Secondary | ICD-10-CM

## 2014-10-19 DIAGNOSIS — H1089 Other conjunctivitis: Secondary | ICD-10-CM

## 2014-10-19 DIAGNOSIS — K2100 Gastro-esophageal reflux disease with esophagitis, without bleeding: Secondary | ICD-10-CM

## 2014-10-19 HISTORY — DX: Gastro-esophageal reflux disease without esophagitis: K21.9

## 2014-10-19 MED ORDER — POLYMYXIN B-TRIMETHOPRIM 10000-0.1 UNIT/ML-% OP SOLN: 1.0000 [drp] | OPHTHALMIC | Status: DC

## 2014-10-19 MED ORDER — SUCRALFATE 1 GM/10ML PO SUSP: 1.0000 g | Freq: Three times a day (TID) | ORAL | Status: DC

## 2014-10-19 MED ORDER — OLOPATADINE HCL 0.2 % OP SOLN: OPHTHALMIC | Status: DC

## 2014-10-19 NOTE — ED Provider Notes (Signed)
CSN: [id number]     Arrival date & time 10/19/14  [id number] History   First MD Initiated Contact with Patient 10/19/14 1010     Chief Complaint  Patient presents with  . Conjunctivitis   (Consider location/radiation/quality/duration/timing/severity/associated sxs/prior Treatment) HPI   L eye: developed redness and puffiness on Sunday after going tot he ED. Getting worse. This am woke up w/ crusted discharge over eyelid. Painful - grainy feeling. Vision is mildly blurry. Denies recent cold or contacts w/ pink eye.   Seen in iED on 10/17/14 for stomach pain. Recently Dx w/ stomach ulcer adn told take prilosec. Getting better but still w/ some discomfort. Not taking anythign else.   Tobacco: cut smoking back from 0.5ppd to only 3 cig per day  Past Medical History  Diagnosis Date  . GERD (gastroesophageal reflux disease)    History reviewed. No pertinent past surgical history. Family History  Problem Relation Age of Onset  . Hypertension Other    History  Substance Use Topics  . Smoking status: Current Every Day Smoker    Types: Cigarettes  . Smokeless tobacco: Never Used  . Alcohol Use: No   OB History    No data available     Review of Systems Per HPI with all other pertinent systems negative.   Allergies  Ampicillin  Home Medications   Prior to Admission medications   Medication Sig Start Date End Date Taking? Authorizing Provider  bismuth subsalicylate (PEPTO BISMOL) 262 MG/15ML suspension Take 15 mLs by mouth every 6 (six) hours as needed. Upset stomach    Historical Provider, MD  clindamycin (CLEOCIN) 300 MG capsule Take 1 capsule (300 mg total) by mouth 3 (three) times daily. Patient not taking: Reported on 10/17/2014 11/23/13   Linna HoffJames D Kindl, MD  HYDROcodone-acetaminophen (NORCO/VICODIN) 5-325 MG per tablet Take 1 tablet by mouth every 6 (six) hours as needed for moderate pain or severe pain. 10/17/14   Courtney A Forcucci, PA-C  levonorgestrel (MIRENA) 20 MCG/24HR  IUD 1 each by Intrauterine route once.    Historical Provider, MD  Olopatadine HCl 0.2 % SOLN 1 drop daily as needed for red eyes 10/19/14   Ozella Rocksavid J Merrell, MD  omeprazole (PRILOSEC) 20 MG capsule Take 1 capsule (20 mg total) by mouth daily. 10/17/14   Courtney A Forcucci, PA-C  ondansetron (ZOFRAN) 8 MG tablet Take 1 tablet (8 mg total) by mouth every 8 (eight) hours as needed for nausea or vomiting. 10/17/14   Courtney A Forcucci, PA-C  sucralfate (CARAFATE) 1 GM/10ML suspension Take 10 mLs (1 g total) by mouth 4 (four) times daily -  with meals and at bedtime. 10/19/14   Ozella Rocksavid J Merrell, MD  trimethoprim-polymyxin b (POLYTRIM) ophthalmic solution Place 1 drop into both eyes every 4 (four) hours. Treat for 5-7 days 10/19/14   Ozella Rocksavid J Merrell, MD   BP 107/67 mmHg  Pulse 78  Temp(Src) 98.8 F (37.1 C) (Oral)  Resp 16  SpO2 99% Physical Exam  ED Course  Procedures (including critical care time) Labs Review Labs Reviewed - No data to display  Imaging Review Dg Chest 2 View  10/17/2014   CLINICAL DATA:  Left-sided chest pain for 3 days  EXAM: CHEST  2 VIEW  COMPARISON:  None.  FINDINGS: The heart size and mediastinal contours are within normal limits. Both lungs are clear. The visualized skeletal structures are unremarkable.  IMPRESSION: No active cardiopulmonary disease.   Electronically Signed   By: Eulah PontMark  Lukens M.D.  On: 10/17/2014 14:17     MDM   1. Bacterial conjunctivitis   2. Gastroesophageal reflux disease with esophagitis    Start polytrim Pataday PRN  ???? pts description of gastric ulcer. Reviewed ED note which said pt may have PUD but nor formal Dx of ulcer. Either way pt to start Sucralfate for symptomatic relief.  Pt motivated to quit. Discussed quitting and gave recommendations  Precautions given and all questions answered  Shelly Flattenavid Merrell, MD Family Medicine 10/19/2014, 10:38 AM      Ozella Rocksavid J Merrell, MD 10/19/14 1038

## 2014-10-19 NOTE — ED Notes (Signed)
Patient c/o bilateral eye swelling with left eye having a film and mucous crust this morning. Patient reports she is unsure if she is having an allergic reaction to medication from and ER visit this weekend. Patient is in NAD. Reports she still has a lot of pain and pressure from ulcer.

## 2014-10-19 NOTE — Discharge Instructions (Signed)
You likely have bacterial conjunctivitis Please start the antibiotics Please use the pataday if your want for redness Please start the sucralfate for the stomach discomfort Continue to work towards stopping smoking Best fo luck

## 2015-02-28 IMAGING — CR DG CHEST 2V
2 series · 2 of 2 positions shown · non-contrast
Comparison: None.

CLINICAL DATA: Left-sided chest pain for 3 days

EXAM:
CHEST  2 VIEW

[w chest pa]
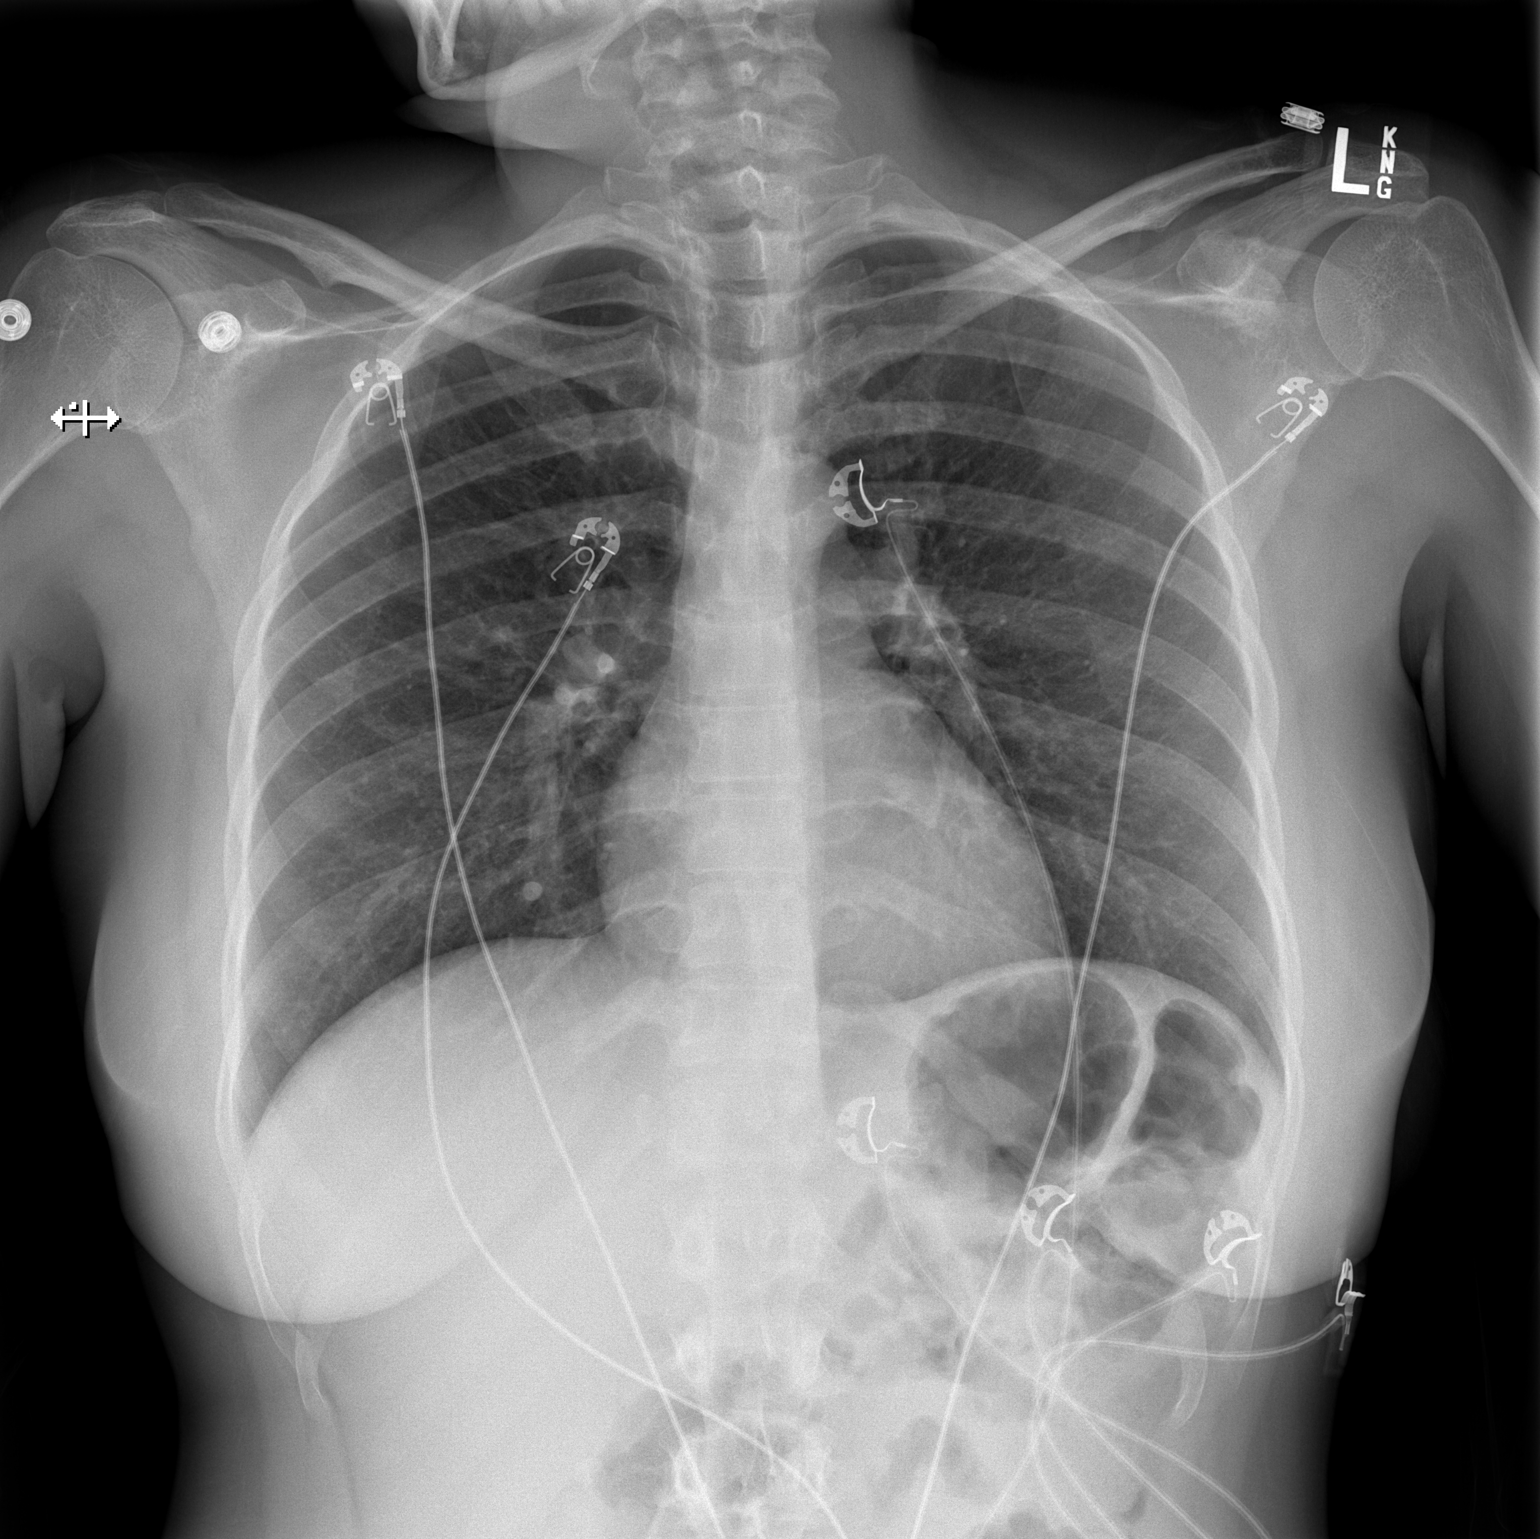

[w chest lat]
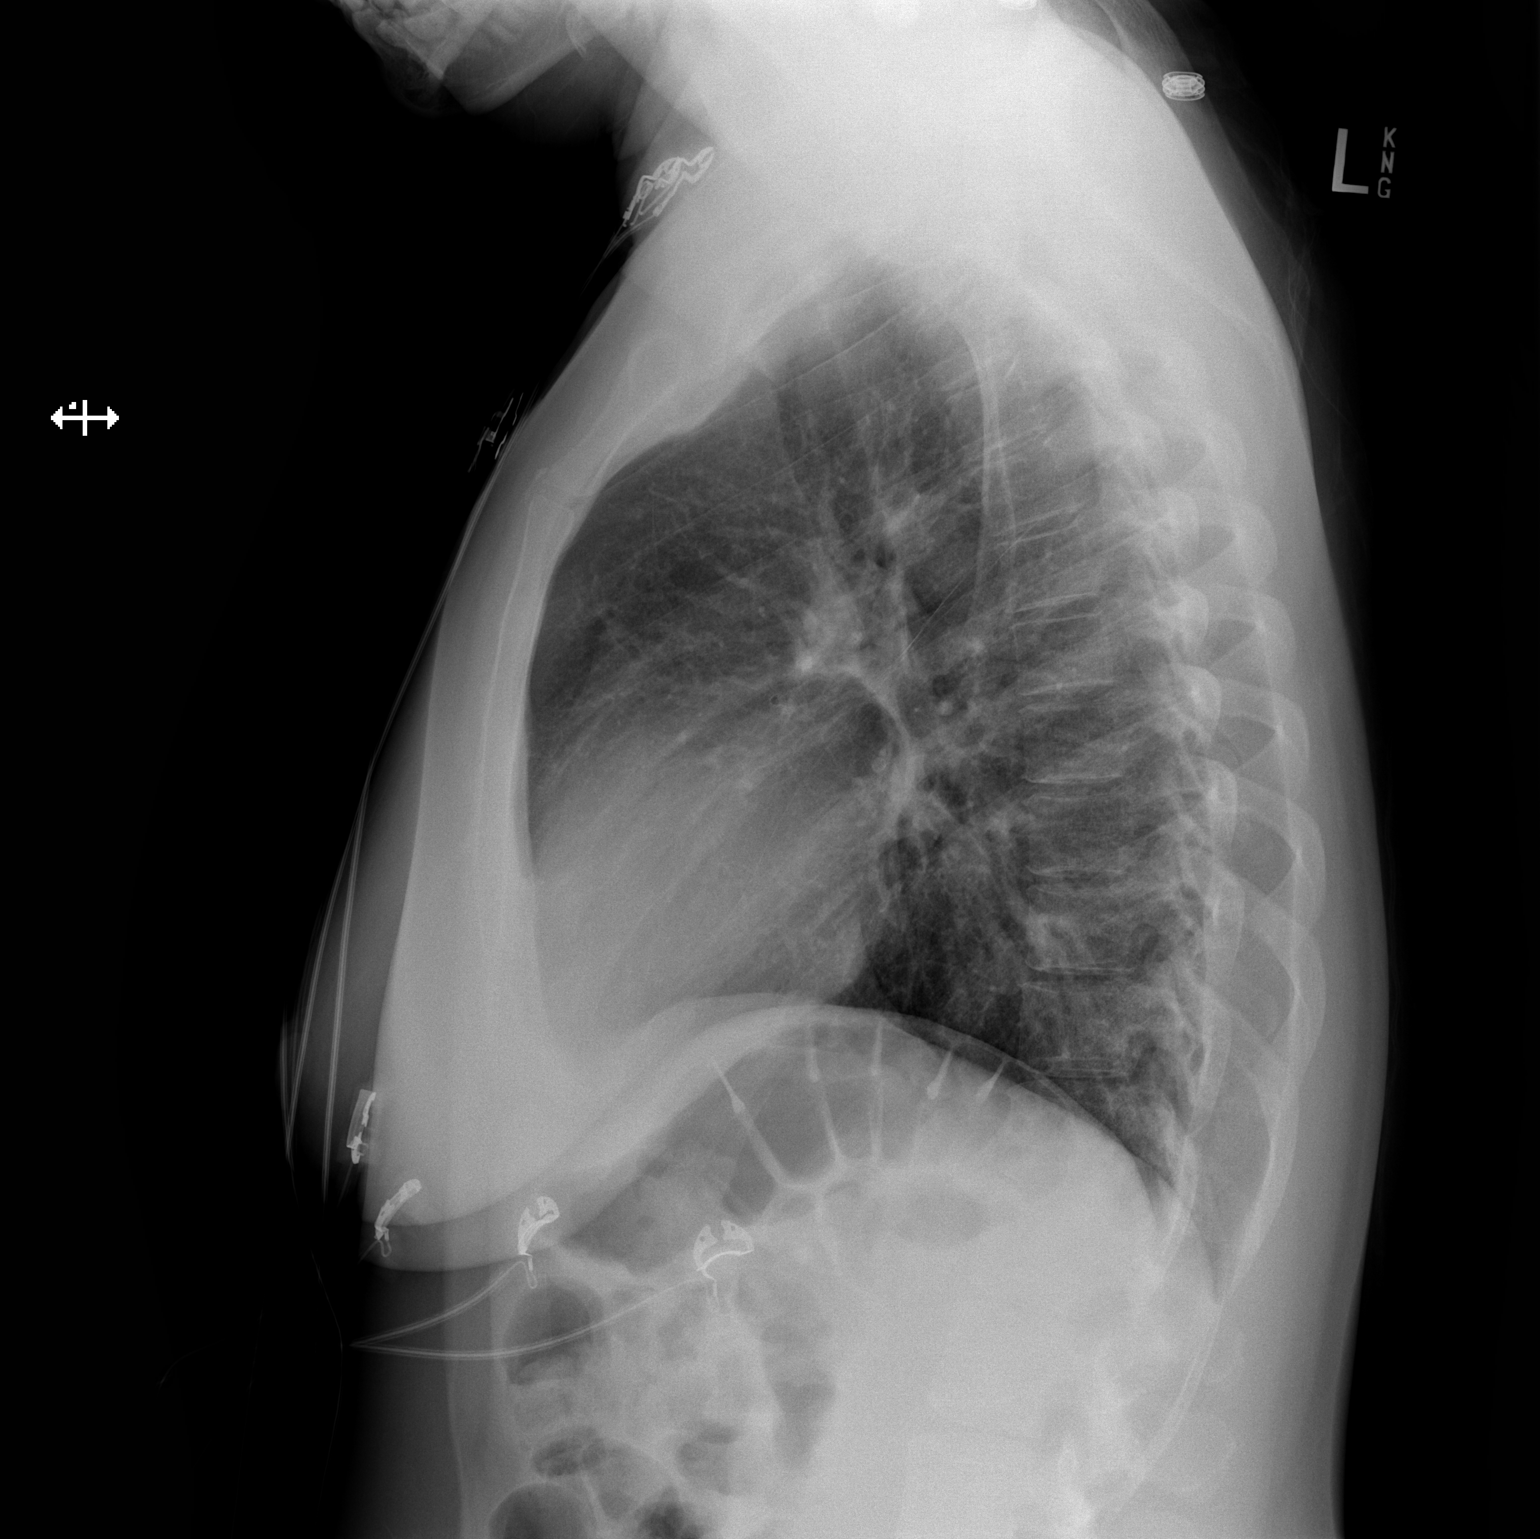

[2 of 2 positions shown; findings below may reference images not displayed]

FINDINGS: The heart size and mediastinal contours are within normal limits.
Both lungs are clear. The visualized skeletal structures are
unremarkable.
IMPRESSION: No active cardiopulmonary disease.

## 2017-10-27 ENCOUNTER — Encounter (HOSPITAL_COMMUNITY): Payer: Self-pay

## 2017-10-27 ENCOUNTER — Emergency Department (HOSPITAL_COMMUNITY)
Admission: EM | Admit: 2017-10-27 | Discharge: 2017-10-27 | Disposition: A | Discharge status: Home / Self Care | Payer: Self-pay | Attending: Emergency Medicine | Admitting: Emergency Medicine

## 2017-10-27 DIAGNOSIS — F17210 Nicotine dependence, cigarettes, uncomplicated: Secondary | ICD-10-CM | POA: Insufficient documentation

## 2017-10-27 DIAGNOSIS — R202 Paresthesia of skin: Secondary | ICD-10-CM | POA: Insufficient documentation

## 2017-10-27 DIAGNOSIS — M79601 Pain in right arm: Secondary | ICD-10-CM | POA: Insufficient documentation

## 2017-10-27 MED ORDER — OXYCODONE-ACETAMINOPHEN 5-325 MG PO TABS: 1.0000 | ORAL_TABLET | ORAL | 0 refills | Status: DC | PRN

## 2017-10-27 MED ORDER — IBUPROFEN 600 MG PO TABS: 600.0000 mg | ORAL_TABLET | Freq: Four times a day (QID) | ORAL | 0 refills | Status: DC | PRN

## 2017-10-27 NOTE — ED Triage Notes (Signed)
Pt complains of numbness in right hand and sharp pains in her right upper arm for a few days

## 2017-10-27 NOTE — ED Provider Notes (Signed)
[facility]-EMERGENCY DEPT Provider Note   CSN: [id number] Arrival date & time: 10/27/17  0131     History   Chief Complaint Chief Complaint  Patient presents with  . Numbness    HPI Christina Kirk is a 36 y.o. female.  The patient complains of sharp, tingling pain in her right upper extremity. She is right hand dominant. The discomfort started in her hand and moved up the arm, now extending to proximal upper arm. No neck pain. The symptoms include sharp tingling sensation that is constant and progressive. She reports she can't hold her purse in her right hand anymore because it makes the pain worse. No known injury. No history of similar symptoms.    The history is provided by the patient. No language interpreter was used.    Past Medical History:  Diagnosis Date  . GERD (gastroesophageal reflux disease)     There are no active problems to display for this patient.   History reviewed. No pertinent surgical history.  OB History    No data available       Home Medications    Prior to Admission medications   Medication Sig Start Date End Date Taking? Authorizing Provider  ibuprofen (ADVIL,MOTRIN) 200 MG tablet Take 400 mg by mouth every 6 (six) hours as needed for headache, mild pain or moderate pain.   Yes [provider]  Olopatadine HCl 0.2 % SOLN 1 drop daily as needed for red eyes Patient not taking: Reported on 10/27/2017 10/19/14   Ozella RocksMerrell, David J, MD  omeprazole (PRILOSEC) 20 MG capsule Take 1 capsule (20 mg total) by mouth daily. Patient not taking: Reported on 10/27/2017 10/17/14   Terri PiedraForcucci, Courtney, PA-C  trimethoprim-polymyxin b (POLYTRIM) ophthalmic solution Place 1 drop into both eyes every 4 (four) hours. Treat for 5-7 days Patient not taking: Reported on 10/27/2017 10/19/14   Ozella RocksMerrell, David J, MD    Family History Family History  Problem Relation Age of Onset  . Hypertension Other     Social History Social  History   Tobacco Use  . Smoking status: Current Every Day Smoker    Types: Cigarettes  . Smokeless tobacco: Never Used  Substance Use Topics  . Alcohol use: No  . Drug use: No     Allergies   Ampicillin   Review of Systems Review of Systems  Constitutional: Negative for fever.  Musculoskeletal: Negative for neck pain.       See HPI.  Skin: Negative.  Negative for color change.  Neurological: Positive for numbness. Negative for weakness.     Physical Exam Updated Vital Signs BP (!) 124/49   Pulse 78   Temp 98.2 F (36.8 C) (Oral)   Resp 16   SpO2 98%   Physical Exam  Constitutional: She is oriented to person, place, and time. She appears well-developed and well-nourished.  Neck: Normal range of motion.  Cardiovascular: Intact distal pulses.  Pulmonary/Chest: Effort normal.  Musculoskeletal:  Right UE is unremarkable in appearance without swelling or redness. FROM all joints. Generally tender to entire extremity.  No midline or paracervical tenderness.   Neurological: She is alert and oriented to person, place, and time. A sensory deficit (Decreased sensation to light touch of right forearm. ) is present.  Skin: Skin is warm and dry.     ED Treatments / Results  Labs (all labs ordered are listed, but only abnormal results are displayed) Labs Reviewed - No data to display  EKG  EKG  Interpretation None       Radiology No results found.  Procedures Procedures (including critical care time)  Medications Ordered in ED Medications - No data to display   Initial Impression / Assessment and Plan / ED Course  I have reviewed the triage vital signs and the nursing notes.  Pertinent labs & imaging results that were available during my care of the patient were reviewed by me and considered in my medical decision making (see chart for details).     Patient here for evaluation of right arm pain and tingling, progressive over the last few days. No injury.  No neck pain.  No evidence of infection or neurovascular compromise. Will refer to neurology or Sports medicine for nerve conduction study for further evaluation.  Final Clinical Impressions(s) / ED Diagnoses   Final diagnoses:  None   1. Right UE pain 2. paresthesia  ED Discharge Orders    None       Elpidio AnisUpstill, Tenna Lacko, PA-C 10/27/17 0539    Derwood KaplanNanavati, Ankit, MD 10/27/17 [phone]

## 2017-10-30 ENCOUNTER — Ambulatory Visit: Payer: Self-pay | Admitting: Diagnostic Neuroimaging

## 2017-10-30 ENCOUNTER — Encounter: Payer: Self-pay | Admitting: Diagnostic Neuroimaging

## 2017-10-30 VITALS — BP 117/76 | HR 74 | Ht 66.0 in | Wt 163.6 lb

## 2017-10-30 DIAGNOSIS — R202 Paresthesia of skin: Secondary | ICD-10-CM

## 2017-10-30 DIAGNOSIS — M79601 Pain in right arm: Secondary | ICD-10-CM

## 2017-10-30 DIAGNOSIS — R200 Anesthesia of skin: Secondary | ICD-10-CM

## 2017-10-30 MED ORDER — PREDNISONE 10 MG PO TABS
ORAL_TABLET | ORAL | 0 refills | Status: DC
Start: 2017-10-30 — End: 2017-11-25

## 2017-10-30 NOTE — Progress Notes (Signed)
[facility]  PATIENT: Christina DuhamelLovie Z Russomanno DOB: 12/31/1980  REFERRING CLINICIAN: ER  HISTORY FROM: patient  REASON FOR VISIT: new consult    HISTORICAL  CHIEF COMPLAINT:  Chief Complaint  Patient presents with  . New Patient (Initial Visit)    Pain in right arm and numbness in fingers, started a few weeks ago.   . Numbness    Patient seen in ED on 10/27/17.     HISTORY OF PRESENT ILLNESS:  36 year old right-handed female here for evaluation of right arm pain and numbness.  Symptoms started 2 weeks ago.  Patient has been working at a new job since October with more manual dexterity work ([employer]) it has been particularly busy this holiday season.  Condition patient's bedframe broke several months ago and now patient is sleeping with mattress and box spring on the floor.  Symptoms affect her throughout the day but particularly wake her up from sleep at night.  She tends to rub and shake her arm to try to improve symptoms.  She is been to the emergency room a few days ago for evaluation and then referred here for consultation.  She is tried using rubbing alcohol, wrapping her arm, heating pads, over-the-counter ibuprofen, over-the-counter muscle rub creams without relief.  Patient denies any neck pain.  No problems with her left arm, legs, face, vision or speech.   REVIEW OF SYSTEMS: Full 14 system review of systems performed and negative with exception of: Joint pain aching muscles not enough sleep.  ALLERGIES: Allergies  Allergen Reactions  . Ampicillin Rash    Has patient had a PCN reaction causing immediate rash, facial/tongue/throat swelling, SOB or lightheadedness with hypotension: yes Has patient had a PCN reaction causing severe rash involving mucus membranes or skin necrosis: no Has patient had a PCN reaction that required hospitalization: no Has patient had a PCN reaction occurring within the last 10 years:  no If all of the above answers are "NO",  then may proceed with Cephalosporin use.     HOME MEDICATIONS: Outpatient Medications Prior to Visit  Medication Sig Dispense Refill  . Olopatadine HCl 0.2 % SOLN 1 drop daily as needed for red eyes 1 Bottle 0  . omeprazole (PRILOSEC) 20 MG capsule Take 1 capsule (20 mg total) by mouth daily. 30 capsule 0  . trimethoprim-polymyxin b (POLYTRIM) ophthalmic solution Place 1 drop into both eyes every 4 (four) hours. Treat for 5-7 days 10 mL 0  . ibuprofen (ADVIL,MOTRIN) 600 MG tablet Take 1 tablet (600 mg total) by mouth every 6 (six) hours as needed. 30 tablet 0  . oxyCODONE-acetaminophen (PERCOCET/ROXICET) 5-325 MG tablet Take 1 tablet by mouth every 4 (four) hours as needed for severe pain. 10 tablet 0   No facility-administered medications prior to visit.     PAST MEDICAL HISTORY: Past Medical History:  Diagnosis Date  . GERD (gastroesophageal reflux disease)     PAST SURGICAL HISTORY: No past surgical history on file.  FAMILY HISTORY: Family History  Problem Relation Age of Onset  . Hypertension Other     SOCIAL HISTORY:  Social History   Socioeconomic History  . Marital status: Single    Spouse name: Not on file  . Number of children: Not on file  . Years of education: Not on file  . Highest education level: Not on file  Social Needs  . Financial resource strain: Not on file  . Food insecurity - worry: Not on file  . Food insecurity -  inability: Not on file  . Transportation needs - medical: Not on file  . Transportation needs - non-medical: Not on file  Occupational History  . Not on file  Tobacco Use  . Smoking status: Current Every Day Smoker    Types: Cigarettes  . Smokeless tobacco: Never Used  Substance and Sexual Activity  . Alcohol use: No  . Drug use: No  . Sexual activity: Not on file  Other Topics Concern  . Not on file  Social History Narrative  . Not on file     PHYSICAL EXAM  GENERAL EXAM/CONSTITUTIONAL: Vitals:  Vitals:   10/30/17  1048  BP: 117/76  Pulse: 74  Weight: 163 lb 9.6 oz (74.2 kg)  Height: 5\' 6"  (1.676 m)     Body mass index is 26.41 kg/m.  No exam data present  Patient is UNCOMFORTABLE; MOVING RIGHT ARM; well developed, nourished and groomed; neck is supple  CARDIOVASCULAR:  Examination of carotid arteries is normal; no carotid bruits  Regular rate and rhythm, no murmurs  Examination of peripheral vascular system by observation and palpation is normal  EYES:  Ophthalmoscopic exam of optic discs and posterior segments is normal; no papilledema or hemorrhages  MUSCULOSKELETAL:  Gait, strength, tone, movements noted in Neurologic exam below  NEUROLOGIC: MENTAL STATUS:  No flowsheet data found.  awake, alert, oriented to person, place and time  recent and remote memory intact  normal attention and concentration  language fluent, comprehension intact, naming intact,   fund of knowledge appropriate  CRANIAL NERVE:   2nd - no papilledema on fundoscopic exam  2nd, 3rd, 4th, 6th - pupils equal and reactive to light, visual fields full to confrontation, extraocular muscles intact, no nystagmus  5th - facial sensation symmetric  7th - facial strength symmetric  8th - hearing intact  9th - palate elevates symmetrically, uvula midline  11th - shoulder shrug symmetric  12th - tongue protrusion midline  MOTOR:   normal bulk and tone, full strength in the LUE, BLE  RUE LIMITED BY PAIN (4+)  SENSORY:   normal and symmetric to light touch, pinprick, temperature, vibration; EXCEPT DECR PP IN RIGHT HAND (ALL DIGITS)  POSITIVE PHALEN'S (--> RIGHT THUMB NUMBNESS)  COORDINATION:   finger-nose-finger, fine finger movements normal  REFLEXES:   deep tendon reflexes present and symmetric  GAIT/STATION:   narrow based gait; able to walk tandem    DIAGNOSTIC DATA (LABS, IMAGING, TESTING) - I reviewed patient records, labs, notes, testing and imaging myself where  available.  Lab Results  Component Value Date   WBC 8.4 10/17/2014   HGB 13.9 10/17/2014   HCT 42.5 10/17/2014   MCV 86.2 10/17/2014   PLT 257 10/17/2014      Component Value Date/Time   NA 140 10/17/2014 1421   K 4.7 10/17/2014 1421   CL 102 10/17/2014 1421   CO2 23 10/17/2014 1421   GLUCOSE 106 (H) 10/17/2014 1421   BUN 10 10/17/2014 1421   CREATININE 0.67 10/17/2014 1421   CALCIUM 9.6 10/17/2014 1421   PROT 7.5 10/17/2014 1421   ALBUMIN 3.9 10/17/2014 1421   AST 17 10/17/2014 1421   ALT 13 10/17/2014 1421   ALKPHOS 54 10/17/2014 1421   BILITOT 0.2 (L) 10/17/2014 1421   GFRNONAA >90 10/17/2014 1421   GFRAA >90 10/17/2014 1421   No results found for: CHOL, HDL, LDLCALC, LDLDIRECT, TRIG, CHOLHDL No results found for: ZOXW9U No results found for: VITAMINB12 No results found for: TSH  ASSESSMENT AND PLAN  36 y.o. year old female here with new onset right arm numbness, pain, since mid December 2018, possibly related to overuse injury (new job since Oct 2018).    Ddx: cervical radiculopathy, peripheral neuropathy (ulnar or median), arthritis; overuse injury?  1. Numbness and tingling in right hand   2. Right arm pain      PLAN: - prednisone dose pack; ice; rest; wrist splint  - monitor for next 4 weeks; then may consider EMG/NCS - may use short course hydrocodone (already rx'd by ER) or we could try gabapentin; patient hesitant due to side effects of sedation  Meds ordered this encounter  Medications  . predniSONE (DELTASONE) 10 MG tablet    Sig: Take 60mg  on day 1. Reduce by 10mg  each subsequent day. (60, 50, 40, 30, 20, 10, stop)    Dispense:  21 tablet    Refill:  0   Return if symptoms worsen or fail to improve. patient will call us with update in next 2-4 weeks    Suanne MarkerVIKRAM R. Jeily Guthridge, MD 10/30/2017, 10:54 AM Certified in Neurology, Neurophysiology and Neuroimaging  [facility] [address], [address] [place], [place]  [id number] [phone]

## 2017-10-30 NOTE — Patient Instructions (Signed)
-   prednisone dose pack - use ice; rest; wrist splint at bedtime - monitor for next 4 weeks; then may consider electrical nerve test (EMG)

## 2017-11-15 ENCOUNTER — Ambulatory Visit: Payer: Self-pay | Admitting: Neurology

## 2017-11-25 ENCOUNTER — Telehealth: Payer: Self-pay | Admitting: Diagnostic Neuroimaging

## 2017-11-25 ENCOUNTER — Other Ambulatory Visit: Payer: Self-pay | Admitting: Diagnostic Neuroimaging

## 2017-11-25 MED ORDER — PREDNISONE 10 MG PO TABS: ORAL_TABLET | ORAL | 0 refills | Status: DC

## 2017-11-25 NOTE — Telephone Encounter (Signed)
I spoke to pt and she relayed that prednisone helped until 3-4 days ago gradual wrosening pain in her r shoulder.  I relayed that prednisone is a anti inflammatory but is not medication to be used longterm.  I told her that is was doubtful that would be prescribed again but would ask, Other recc?  No insurance, but is now working permanent but is not FT.  No idea of insurance.  Please advise.

## 2017-11-25 NOTE — Telephone Encounter (Signed)
I spoke to pt and relayed that Dr. Marjory LiesPenumalli was ok to refill one more time for her.  She would then need to see pcp, urgent care or ref to sports med or ortho clinic.  She was appreciative and is trying to save money to come back to us, I relayed that the ref to sports med or ortho (may be able to give steroid inj that would last longer).  She will work on what she will do in the future.

## 2017-11-25 NOTE — Addendum Note (Signed)
Addended by: Guy BeginYOUNG, Kennetha Pearman S on: 11/25/2017 11:31 AM   Modules accepted: Orders

## 2017-11-25 NOTE — Telephone Encounter (Signed)
Ok to refill prednisone 1 more time. Then she would need to see PCP, urgent care or referral to sports med / ortho clinic. -VRP

## 2017-11-25 NOTE — Telephone Encounter (Signed)
Pt has called to inform that due to not having insurance she is unable to come back in for another appointment at this time but would like to know if she can get a refill for predniSONE (DELTASONE) 10 MG tablet. If so please send to   [id number] [place] , [place] - [address] [facility] [address] [phone] (Phone) [phone] (Fax)   Please call

## 2018-11-19 ENCOUNTER — Emergency Department (HOSPITAL_COMMUNITY)
Admission: EM | Admit: 2018-11-19 | Discharge: 2018-11-19 | Disposition: A | Discharge status: Home / Self Care | Payer: Self-pay | Attending: Emergency Medicine | Admitting: Emergency Medicine

## 2018-11-19 ENCOUNTER — Emergency Department (HOSPITAL_COMMUNITY): Payer: Self-pay

## 2018-11-19 ENCOUNTER — Other Ambulatory Visit: Payer: Self-pay

## 2018-11-19 ENCOUNTER — Encounter (HOSPITAL_COMMUNITY): Payer: Self-pay

## 2018-11-19 DIAGNOSIS — J069 Acute upper respiratory infection, unspecified: Secondary | ICD-10-CM | POA: Insufficient documentation

## 2018-11-19 DIAGNOSIS — F17210 Nicotine dependence, cigarettes, uncomplicated: Secondary | ICD-10-CM | POA: Insufficient documentation

## 2018-11-19 DIAGNOSIS — B9789 Other viral agents as the cause of diseases classified elsewhere: Secondary | ICD-10-CM

## 2018-11-19 NOTE — ED Provider Notes (Signed)
[facility]-EMERGENCY DEPT Provider Note   CSN: [id number] Arrival date & time: 11/19/18  0806     History   Chief Complaint Chief Complaint  Patient presents with  . Nasal Congestion  . Cough  . Generalized Body Aches  . Otalgia    HPI Christina Kirk is a 38 y.o. female with past medical history of GERD, presenting to the emergency department with 2 days of productive cough. Pt states cough is productive of green sputum with some blood tinge to it.  Associated nasal congestion that is also tinged with blood.  She has generalized body aches and complains of left ear pain.  She is taking NyQuil and other over-the-counter medications without relief.  No history of DVT or PE, no exogenous estrogen use, difficulty breathing, recent travel/trauma/surgery, or pregnancy, no hx CA.   The history is provided by the patient.    Past Medical History:  Diagnosis Date  . GERD (gastroesophageal reflux disease)     There are no active problems to display for this patient.   History reviewed. No pertinent surgical history.   OB History   No obstetric history on file.      Home Medications    Prior to Admission medications   Medication Sig Start Date End Date Taking? Authorizing Provider  ibuprofen (ADVIL,MOTRIN) 200 MG tablet Take 400 mg by mouth every 6 (six) hours as needed for headache or mild pain.   Yes [provider]  Pseudoeph-Doxylamine-DM-APAP (DAYQUIL/NYQUIL COLD/FLU RELIEF PO) Take 1 capsule by mouth 2 (two) times daily.   Yes [provider]  Olopatadine HCl 0.2 % SOLN 1 drop daily as needed for red eyes Patient not taking: Reported on 11/19/2018 10/19/14   Ozella RocksMerrell, David J, MD  omeprazole (PRILOSEC) 20 MG capsule Take 1 capsule (20 mg total) by mouth daily. Patient not taking: Reported on 11/19/2018 10/17/14   Shirleen Schirmerllis, Courtney, PA-C  predniSONE (DELTASONE) 10 MG tablet Take 60mg  on day 1. Reduce by 10mg  each subsequent day. (60, 50,  40, 30, 20, 10, stop) Patient not taking: Reported on 11/19/2018 11/25/17   Penumalli, Glenford BayleyVikram R, MD  trimethoprim-polymyxin b (POLYTRIM) ophthalmic solution Place 1 drop into both eyes every 4 (four) hours. Treat for 5-7 days Patient not taking: Reported on 11/19/2018 10/19/14   Ozella RocksMerrell, David J, MD    Family History Family History  Problem Relation Age of Onset  . Hypertension Other     Social History Social History   Tobacco Use  . Smoking status: Current Every Day Smoker    Packs/day: 0.50    Types: Cigarettes  . Smokeless tobacco: Never Used  Substance Use Topics  . Alcohol use: No  . Drug use: No     Allergies   Ampicillin   Review of Systems Review of Systems  Constitutional: Negative for fever.  HENT: Positive for congestion and ear pain. Negative for sore throat.   Respiratory: Positive for cough.   All other systems reviewed and are negative.    Physical Exam Updated Vital Signs BP 105/86 (BP Location: Right Arm)   Pulse 77   Temp 98.3 F (36.8 C) (Oral)   Resp 18   Ht 5\' 6"  (1.676 m)   Wt 74.8 kg   SpO2 99%   BMI 26.63 kg/m   Physical Exam Vitals signs and nursing note reviewed.  Constitutional:      General: She is not in acute distress.    Appearance: She is well-developed. She is not  ill-appearing.  HENT:     Head: Normocephalic and atraumatic.     Right Ear: Tympanic membrane and ear canal normal.     Left Ear: Ear canal normal.  No middle ear effusion. No mastoid tenderness. Tympanic membrane is erythematous.     Mouth/Throat:     Mouth: Mucous membranes are moist.     Pharynx: Oropharynx is clear. Uvula midline. No oropharyngeal exudate or uvula swelling.  Eyes:     Conjunctiva/sclera: Conjunctivae normal.  Neck:     Musculoskeletal: Normal range of motion. No neck rigidity.  Cardiovascular:     Rate and Rhythm: Normal rate and regular rhythm.  Pulmonary:     Effort: Pulmonary effort is normal.     Breath sounds: Normal breath  sounds.  Lymphadenopathy:     Cervical: No cervical adenopathy.  Neurological:     Mental Status: She is alert.  Psychiatric:        Mood and Affect: Mood normal.        Behavior: Behavior normal.      ED Treatments / Results  Labs (all labs ordered are listed, but only abnormal results are displayed) Labs Reviewed - No data to display  EKG None  Radiology Dg Chest 2 View  Result Date: 11/19/2018 CLINICAL DATA:  Cough and chest pain for several days EXAM: CHEST - 2 VIEW COMPARISON:  10/17/2014 FINDINGS: The heart size and mediastinal contours are within normal limits. Both lungs are clear. The visualized skeletal structures are unremarkable. IMPRESSION: No active cardiopulmonary disease. Electronically Signed   By: Alcide Clever M.D.   On: 11/19/2018 11:01    Procedures Procedures (including critical care time)  Medications Ordered in ED Medications - No data to display   Initial Impression / Assessment and Plan / ED Course  I have reviewed the triage vital signs and the nursing notes.  Pertinent labs & imaging results that were available during my care of the patient were reviewed by me and considered in my medical decision making (see chart for details).     Patients symptoms are consistent with URI, likely viral etiology. Afebrile, tolerating secretions.  Lungs clear to auscultation bilaterally. CXR negative for acute infiltrate. Blood-tinged sputum likely 2/t irritation. Low risk wells, normal vital signs. No risk factors for PE. Discussed that antibiotics are not indicated for viral infections. Pt will be discharged with symptomatic treatment.  Verbalizes understanding. Pt is hemodynamically stable & in NAD prior to dc.  Discussed results, findings, treatment and follow up. Patient advised of return precautions.  Final Clinical Impressions(s) / ED Diagnoses   Final diagnoses:  Viral URI with cough    ED Discharge Orders    None       , [place] N,  PA-C 11/19/18 1208    Lorre Nick, MD 11/20/18 1615

## 2018-11-19 NOTE — ED Triage Notes (Addendum)
Patient c/o a productive cough with green and blood-tinged sputum, nasal congestion streaked with blood, body aches x 2 days. Patient also c/o left ear pain.

## 2018-11-19 NOTE — Discharge Instructions (Addendum)
Your chest xray is normal. You do not need an antibiotic today, this is likely viral. Treat with over the counter medications to help your symptoms. You can take tylenol or ibuprofen as needed for sore throat or fever.  Drink plenty of water.  Use saline nasal spray for congestion. Follow up with your primary care provider as needed.  Return to the ER for inability to swallow liquids, difficulty breathing, or new or worsening symptoms.

## 2018-11-19 NOTE — ED Notes (Signed)
Bed: [facility] Expected date:  Expected time:  Means of arrival:  Comments: 

## 2019-04-02 IMAGING — CR DG CHEST 2V
2 series · 2 of 2 positions shown · non-contrast
Comparison: 10/17/2014

CLINICAL DATA: Cough and chest pain for several days

EXAM:
CHEST - 2 VIEW

[w chest pa]
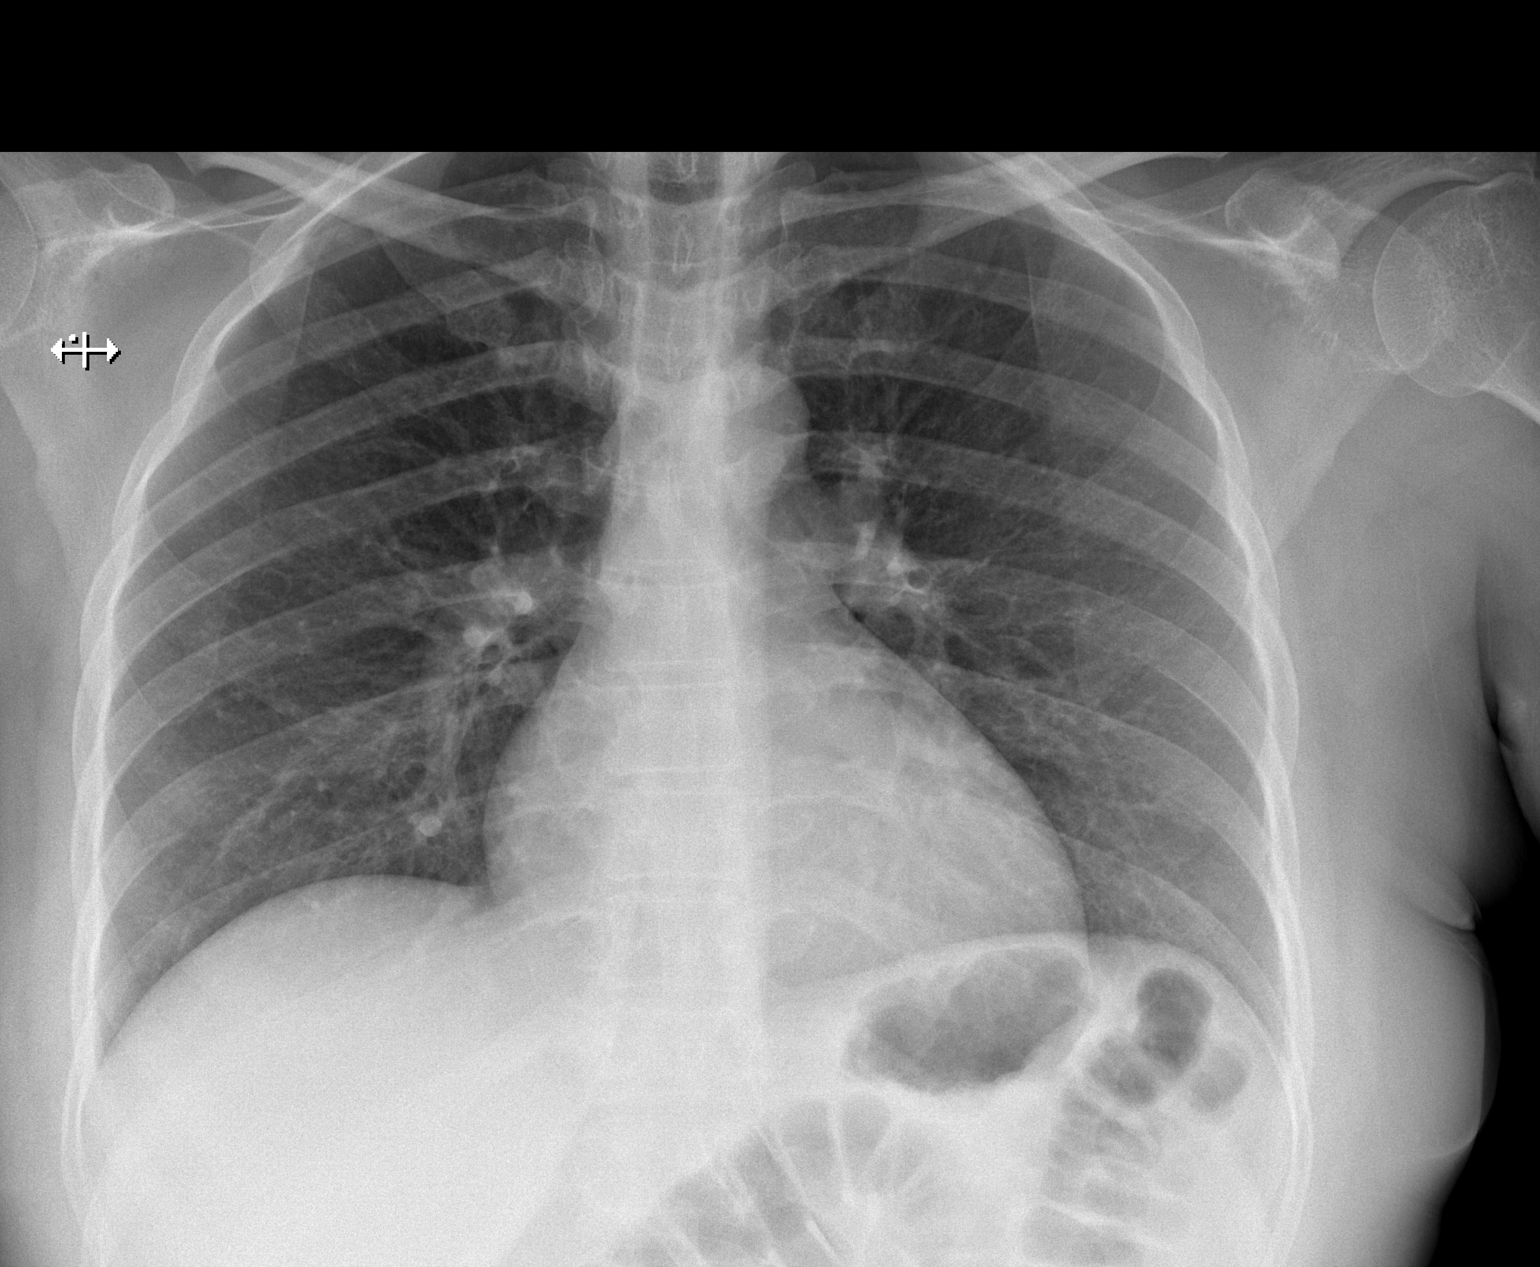

[w chest lat]
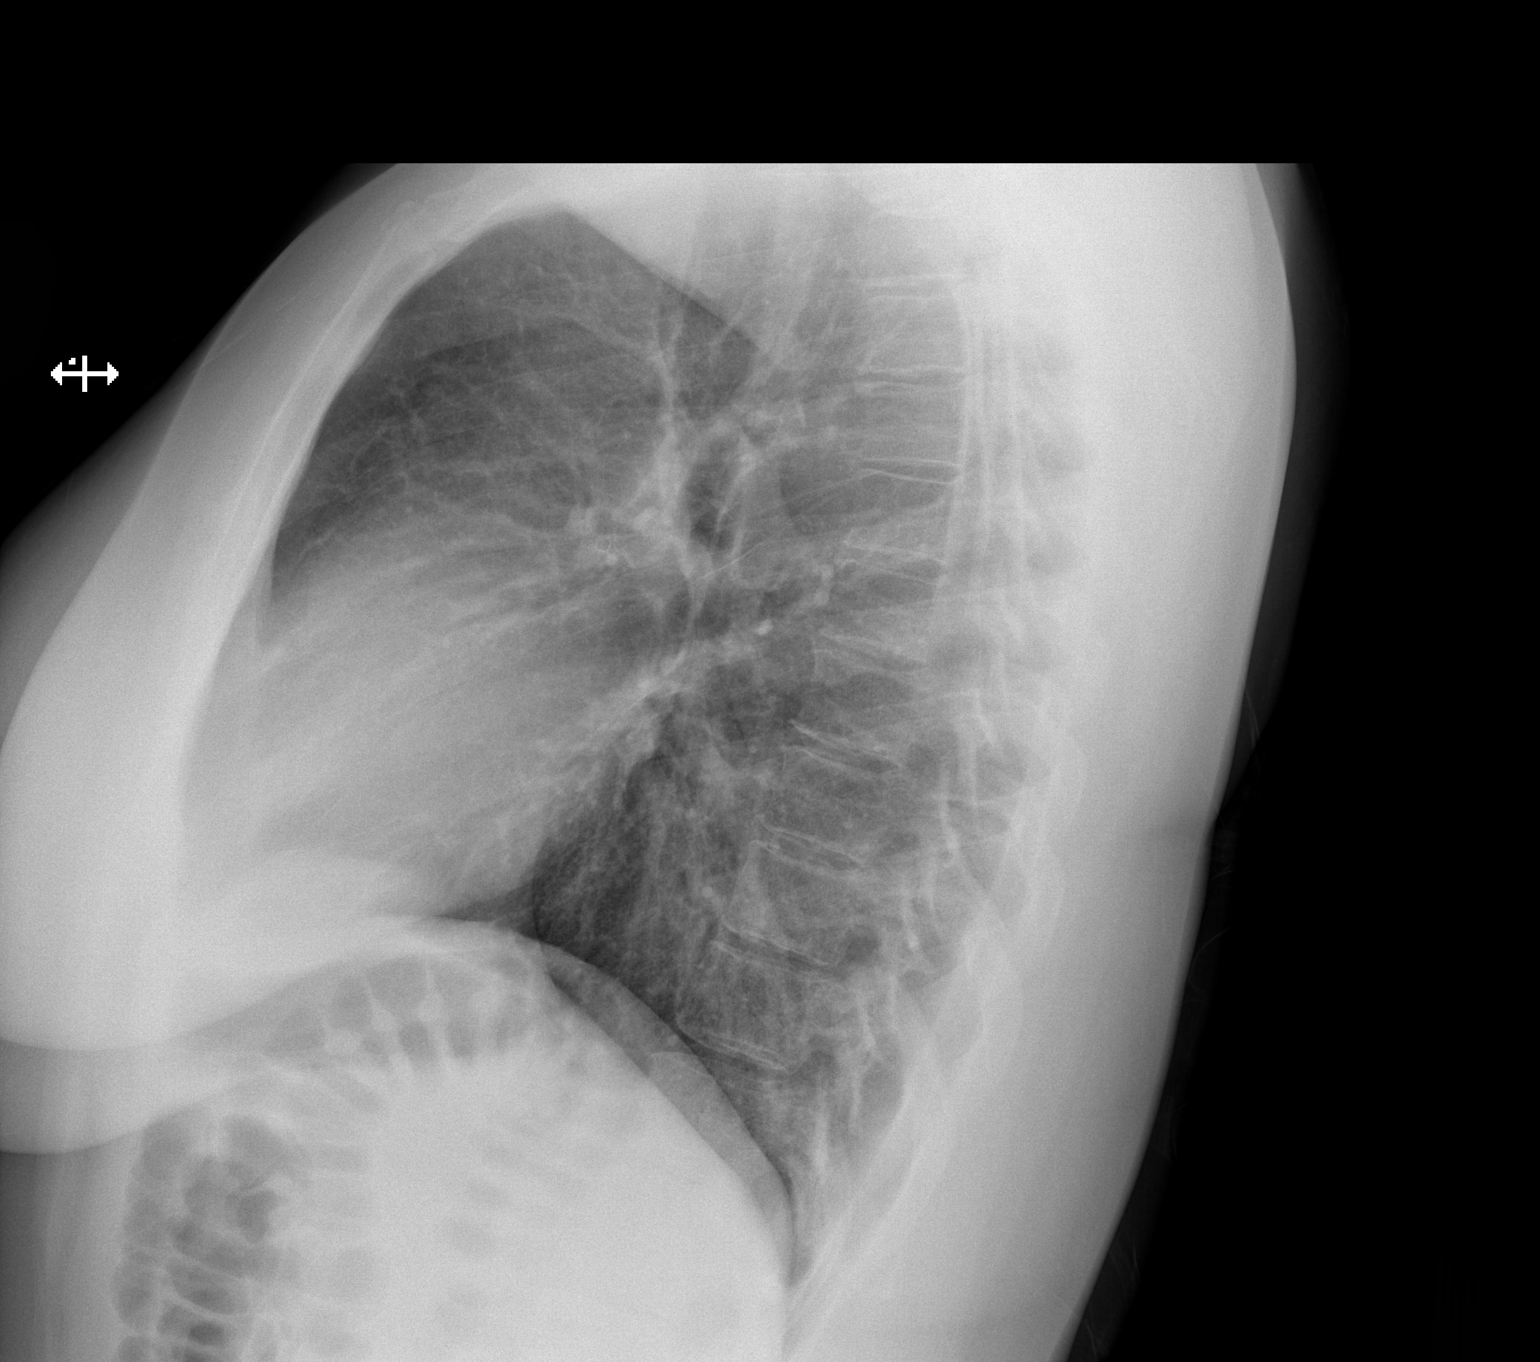

[2 of 2 positions shown; findings below may reference images not displayed]

FINDINGS: The heart size and mediastinal contours are within normal limits.
Both lungs are clear. The visualized skeletal structures are
unremarkable.
IMPRESSION: No active cardiopulmonary disease.

## 2020-11-15 ENCOUNTER — Other Ambulatory Visit: Payer: Self-pay

## 2020-11-15 ENCOUNTER — Ambulatory Visit (HOSPITAL_COMMUNITY)
Admission: EM | Admit: 2020-11-15 | Discharge: 2020-11-15 | Disposition: A | Discharge status: Home / Self Care | Payer: Self-pay | Attending: Student | Admitting: Student

## 2020-11-15 ENCOUNTER — Encounter (HOSPITAL_COMMUNITY): Payer: Self-pay

## 2020-11-15 DIAGNOSIS — G5632 Lesion of radial nerve, left upper limb: Secondary | ICD-10-CM

## 2020-11-15 MED ORDER — PREDNISONE 20 MG PO TABS: 20.0000 mg | ORAL_TABLET | Freq: Every day | ORAL | 0 refills | Status: AC

## 2020-11-15 MED ORDER — METAXALONE 800 MG PO TABS: 800.0000 mg | ORAL_TABLET | Freq: Three times a day (TID) | ORAL | 0 refills | Status: DC

## 2020-11-15 MED ORDER — GABAPENTIN 100 MG PO CAPS: 300.0000 mg | ORAL_CAPSULE | Freq: Three times a day (TID) | ORAL | 1 refills | Status: DC

## 2020-11-15 MED ORDER — GABAPENTIN 300 MG PO CAPS: 300.0000 mg | ORAL_CAPSULE | Freq: Three times a day (TID) | ORAL | 1 refills | Status: DC

## 2020-11-15 NOTE — ED Provider Notes (Signed)
[facility]    CSN: [id number] Arrival date & time: 11/15/20  8841      History   Chief Complaint Chief Complaint  Patient presents with  . Shoulder Pain    HPI Christina Kirk is a 40 y.o. female presenting with left shoulder pain radiating down to left arm with left hand tingling. Began on 12/10 after receiving covid vaccine. Moving the arm makes pain worse. States pain radiates from left side of neck down arm to fingers, and extends partway down her back as well. Pain is worse with movement of L arm and with touching her neck/arm. Weakness in the arm, making it difficult to work. Denies chest pain, shortness of breath, nausea. Tylenol and ibuprofen providing minimal relief. Wrist and back brace providing some relief.  HPI  Past Medical History:  Diagnosis Date  . GERD (gastroesophageal reflux disease)     There are no problems to display for this patient.   History reviewed. No pertinent surgical history.  OB History   No obstetric history on file.      Home Medications    Prior to Admission medications   Medication Sig Start Date End Date Taking? Authorizing Provider  ibuprofen (ADVIL,MOTRIN) 200 MG tablet Take 400 mg by mouth every 6 (six) hours as needed for headache or mild pain.   Yes [provider]  metaxalone (SKELAXIN) 800 MG tablet Take 1 tablet (800 mg total) by mouth 3 (three) times daily. 11/15/20  Yes Rhys Martini, PA-C  predniSONE (DELTASONE) 20 MG tablet Take 1 tablet (20 mg total) by mouth daily for 5 days. 11/15/20 11/20/20 Yes Rhys Martini, PA-C  gabapentin (NEURONTIN) 100 MG capsule Take 3 capsules (300 mg total) by mouth 3 (three) times daily. 11/15/20   Rhys Martini, PA-C  Pseudoeph-Doxylamine-DM-APAP (DAYQUIL/NYQUIL COLD/FLU RELIEF PO) Take 1 capsule by mouth 2 (two) times daily.    [provider]  omeprazole (PRILOSEC) 20 MG capsule Take 1 capsule (20 mg total) by mouth daily. Patient not taking: Reported on  11/19/2018 10/17/14 11/15/20  Shirleen Schirmer, PA-C    Family History Family History  Problem Relation Age of Onset  . Hypertension Other     Social History Social History   Tobacco Use  . Smoking status: Current Every Day Smoker    Packs/day: 0.50    Types: Cigarettes  . Smokeless tobacco: Never Used  Vaping Use  . Vaping Use: Never used  Substance Use Topics  . Alcohol use: No  . Drug use: No     Allergies   Ampicillin   Review of Systems Review of Systems  Musculoskeletal:       L neck and arm pain and tingling   All other systems reviewed and are negative.    Physical Exam Triage Vital Signs ED Triage Vitals  Enc Vitals Group     BP 11/15/20 0932 131/75     Pulse Rate 11/15/20 0932 (!) 58     Resp 11/15/20 0932 16     Temp 11/15/20 0932 99.1 F (37.3 C)     Temp Source 11/15/20 0932 Oral     SpO2 11/15/20 0932 98 %     Weight --      Height --      Head Circumference --      Peak Flow --      Pain Score 11/15/20 0933 8     Pain Loc --      Pain Edu? --  Excl. in GC? --    No data found.  Updated Vital Signs BP 131/75 (BP Location: Right Arm)   Pulse (!) 58   Temp 99.1 F (37.3 C) (Oral)   Resp 16   SpO2 98%   Visual Acuity Right Eye Distance:   Left Eye Distance:   Bilateral Distance:    Right Eye Near:   Left Eye Near:    Bilateral Near:     Physical Exam Vitals reviewed.  Constitutional:      Appearance: Normal appearance.  Cardiovascular:     Rate and Rhythm: Normal rate and regular rhythm.     Heart sounds: Normal heart sounds.  Pulmonary:     Effort: Pulmonary effort is normal.     Breath sounds: Normal breath sounds.  Musculoskeletal:     Comments: L shoulder pain with abduction, radiating down arm to fingertips. Pain with flexion of neck. Pain with palpation of cervical paraspinous muscles. Pain with palpation of deltoid muscle. Grip strength 2/5 and with pain. Shoulder ROM intact but with significant pain.   Neurological:     General: No focal deficit present.     Mental Status: She is alert and oriented to person, place, and time.     Comments: CN 2-12 intact  Psychiatric:        Attention and Perception: Attention and perception normal.        Mood and Affect: Affect is tearful.      UC Treatments / Results  Labs (all labs ordered are listed, but only abnormal results are displayed) Labs Reviewed - No data to display  EKG   Radiology No results found.  Procedures Procedures (including critical care time)  Medications Ordered in UC Medications - No data to display  Initial Impression / Assessment and Plan / UC Course  I have reviewed the triage vital signs and the nursing notes.  Pertinent labs & imaging results that were available during my care of the patient were reviewed by me and considered in my medical decision making (see chart for details).     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  She is not a diabetic. Prednisone 20mg  x5 days as below.  Gabapentin 100mg  up to tid as needed  Metaxalone as needed up to tid  F/u with neurology if symptoms worsen or persist- information provided.   Seek immediate medical attention if chest pain, shortness of breath, etc.   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 This can give you energy, so take in the morning.  -Take gabapentin as needed, up to three times daily. This can make you drowsy so try at night or when you don't need to drive. -You can also try Metaxalone (muscle relaxer) for your pain, up to three times daily. This can make you drowsy, so try at night or when you don't need to drive.  -If your symptoms worsen or persist despite treatment, follow-up with neurology (information below)    ED Prescriptions    Medication Sig Dispense Auth. Provider    gabapentin (NEURONTIN) 300 MG capsule  (Status: Discontinued) Take 1 capsule (300 mg total) by mouth 3 (three) times daily. 30 capsule , PA-C   metaxalone (SKELAXIN) 800 MG tablet Take 1 tablet (800 mg total) by mouth 3 (three) times daily. 21 tablet  Rhys Martini, PA-C   predniSONE (DELTASONE) 20 MG tablet Take 1 tablet (20 mg total) by mouth daily for 5 days. 5 tablet Rhys Martini, PA-C   gabapentin (NEURONTIN) 100 MG capsule Take 3 capsules (300 mg total) by mouth 3 (three) times daily. 30 capsule Rhys Martini, PA-C     PDMP not reviewed this encounter.   Rhys Martini, PA-C 11/15/20 1025

## 2020-11-15 NOTE — ED Triage Notes (Signed)
Pt c/o left shoulder pain radiating to left arm with left hand tingling since Dec 10 after pt received the COVID vaccine in her left arm. Pt states pain increases with left arm rotation, abduction/adduction.  Denies CP, SOB, nausea.  Has been taking ibuprofen and wearing left hand brace, back brace.

## 2020-11-15 NOTE — Discharge Instructions (Signed)
-  For your nerve pain, start Prednisone 20mg  (1 pill) for 5 days. This can give you energy, so take in the morning.  -Take gabapentin as needed, up to three times daily. This can make you drowsy so try at night or when you don't need to drive. -You can also try Metaxalone (muscle relaxer) for your pain, up to three times daily. This can make you drowsy, so try at night or when you don't need to drive.  -If your symptoms worsen or persist despite treatment, follow-up with neurology (information below)

## 2023-08-19 ENCOUNTER — Encounter: Payer: Self-pay | Admitting: Obstetrics and Gynecology

## 2023-08-19 ENCOUNTER — Other Ambulatory Visit: Payer: Self-pay

## 2023-08-19 ENCOUNTER — Ambulatory Visit (INDEPENDENT_AMBULATORY_CARE_PROVIDER_SITE_OTHER): Payer: Medicaid Other | Admitting: Obstetrics and Gynecology

## 2023-08-19 ENCOUNTER — Other Ambulatory Visit (HOSPITAL_COMMUNITY)
Admission: RE | Admit: 2023-08-19 | Discharge: 2023-08-19 | Disposition: A | Discharge status: Home / Self Care | Payer: Medicaid Other | Source: Ambulatory Visit | Attending: Obstetrics and Gynecology | Admitting: Obstetrics and Gynecology

## 2023-08-19 VITALS — BP 118/79 | HR 65 | Ht 66.0 in | Wt 160.0 lb

## 2023-08-19 DIAGNOSIS — Z1231 Encounter for screening mammogram for malignant neoplasm of breast: Secondary | ICD-10-CM | POA: Diagnosis not present

## 2023-08-19 DIAGNOSIS — Z01419 Encounter for gynecological examination (general) (routine) without abnormal findings: Secondary | ICD-10-CM | POA: Insufficient documentation

## 2023-08-19 DIAGNOSIS — Z3009 Encounter for other general counseling and advice on contraception: Secondary | ICD-10-CM | POA: Diagnosis not present

## 2023-08-19 NOTE — Progress Notes (Signed)
Phq 9 & gad7 elevated- will schedule appt with Asher Muir at checkout

## 2023-08-19 NOTE — Progress Notes (Signed)
   CC: contraceptive counseling Subjective:    Patient ID: Christina Kirk, female    DOB: 11-Feb-1981, 42 y.o.   MRN: [id number]  HPI 42 yo G3P3, SVD x 2 seen for contraceptive counseling. Pt initially wanted tubal ligation, but was concerned regarding return of heavy uncomfortable menses.  Pt advised that after bilateral salpingectomy menses would still return.  Pt is currently using depo provera for control of menses and contraception.  Discussed with patient that progesterone IUD may continue amenorrhea without significant complications.  Pt is interested in this option.   Review of Systems     Objective:   Physical Exam Vitals:   08/19/23 1119  BP: 118/79  Pulse: 65         Assessment & Plan:   1. Encounter for screening mammogram for breast cancer  - MM Digital Screening; Future  2. Pap smear, as part of routine gynecological examination  - Cytology - PAP( [place])  3. Encounter for counseling regarding contraception Pt to return in 2-4 weeks for mirena IUD placement.  I spent 30 minutes dedicated to the care of this patient including previsit review of records, face to face time with the patient discussing treatment options and post visit testing.     Warden Fillers, MD Faculty Attending, [facility]

## 2023-08-19 NOTE — Patient Instructions (Signed)
[facility]  [address], [place], [place] [address] Phone: [phone] November 5th @ 5pm

## 2023-08-21 ENCOUNTER — Encounter: Payer: Self-pay | Admitting: *Deleted

## 2023-08-21 LAB — CYTOLOGY - PAP
Comment: NEGATIVE
Diagnosis: UNDETERMINED — AB
High risk HPV: NEGATIVE

## 2023-08-22 NOTE — BH Specialist Note (Signed)
Integrated Behavioral Health via Telemedicine Visit  08/22/2023 DANIQUA CAMPOY [id number]  Number of Integrated Behavioral Health Clinician visits: No data recorded Session Start time: No data recorded  Session End time: No data recorded Total time in minutes: No data recorded  Referring Provider: *** Patient/Family location: *** [facility] Provider location: *** All persons participating in visit: *** Types of Service: {CHL AMB TYPE OF SERVICE:8065769865}  I connected with Jetty Duhamel and/or Emmalia Z Gadsby's {family members:20773} via  Telephone or Video Enabled Telemedicine Application  (Video is Caregility application) and verified that I am speaking with the correct person using two identifiers. Discussed confidentiality: {YES/NO:21197}  I discussed the limitations of telemedicine and the availability of in person appointments.  Discussed there is a possibility of technology failure and discussed alternative modes of communication if that failure occurs.  I discussed that engaging in this telemedicine visit, they consent to the provision of behavioral healthcare and the services will be billed under their insurance.  Patient and/or legal guardian expressed understanding and consented to Telemedicine visit: {YES/NO:21197}  Presenting Concerns: Patient and/or family reports the following symptoms/concerns: *** Duration of problem: ***; Severity of problem: {Mild/Moderate/Severe:20260}  Patient and/or Family's Strengths/Protective Factors: {CHL AMB BH PROTECTIVE [phone]}  Goals Addressed: Patient will:  Reduce symptoms of: {IBH Symptoms:21014056}   Increase knowledge and/or ability of: {IBH Patient Tools:21014057}   Demonstrate ability to: {IBH Goals:21014053}  Progress towards Goals: {CHL AMB BH PROGRESS TOWARDS GOALS:2295092587}  Interventions: Interventions utilized:  {IBH Interventions:21014054} Standardized Assessments completed: {IBH Screening  Tools:21014051}  Patient and/or Family Response: ***  Assessment: Patient currently experiencing ***.   Patient may benefit from ***.  Plan: Follow up with behavioral health clinician on : *** Behavioral recommendations: *** Referral(s): {IBH Referrals:21014055}  I discussed the assessment and treatment plan with the patient and/or parent/guardian. They were provided an opportunity to ask questions and all were answered. They agreed with the plan and demonstrated an understanding of the instructions.   They were advised to call back or seek an in-person evaluation if the symptoms worsen or if the condition fails to improve as anticipated.  Valetta Close Neveah Bang, LCSW

## 2023-09-04 ENCOUNTER — Ambulatory Visit: Payer: Medicaid Other | Admitting: Clinical

## 2023-09-10 ENCOUNTER — Ambulatory Visit: Payer: Medicaid Other

## 2023-09-16 ENCOUNTER — Ambulatory Visit: Payer: Medicaid Other | Admitting: Obstetrics and Gynecology

## 2023-09-18 NOTE — BH Specialist Note (Unsigned)
Pt did not arrive to video visit and did answer the phone, said "give me a minute", then phone cut off; Left HIPPA-compliant message to call back Asher Muir from [facility] [employer] [facility] at  [phone] [facility] office) at follow up call. ; Unable to leave MyChart message as not set up.

## 2023-10-02 ENCOUNTER — Ambulatory Visit: Payer: Medicaid Other | Admitting: Clinical
